# Patient Record
Sex: Female | Born: 1980 | Hispanic: No | Marital: Married | Smoking: Never smoker
Health system: Southern US, Community
[De-identification: ages and names within clinical notes are randomized; demographics above are authoritative.]

## PROBLEM LIST (undated history)

## (undated) ENCOUNTER — Inpatient Hospital Stay (HOSPITAL_COMMUNITY): Payer: Self-pay

## (undated) DIAGNOSIS — Z789 Other specified health status: Secondary | ICD-10-CM

## (undated) HISTORY — DX: Other specified health status: Z78.9

---

## 2017-03-31 ENCOUNTER — Other Ambulatory Visit (HOSPITAL_COMMUNITY)
Admission: RE | Admit: 2017-03-31 | Discharge: 2017-03-31 | Disposition: A | Discharge status: Home / Self Care | Payer: Medicaid Other | Source: Ambulatory Visit | Attending: Nurse Practitioner | Admitting: Nurse Practitioner

## 2017-03-31 ENCOUNTER — Encounter: Payer: Self-pay | Admitting: Nurse Practitioner

## 2017-03-31 ENCOUNTER — Other Ambulatory Visit: Payer: Self-pay

## 2017-03-31 ENCOUNTER — Ambulatory Visit (INDEPENDENT_AMBULATORY_CARE_PROVIDER_SITE_OTHER): Payer: Medicaid Other | Admitting: Nurse Practitioner

## 2017-03-31 ENCOUNTER — Encounter: Payer: Self-pay | Admitting: *Deleted

## 2017-03-31 VITALS — BP 124/46 | HR 66 | Wt 152.5 lb

## 2017-03-31 DIAGNOSIS — O09523 Supervision of elderly multigravida, third trimester: Secondary | ICD-10-CM | POA: Insufficient documentation

## 2017-03-31 DIAGNOSIS — O09522 Supervision of elderly multigravida, second trimester: Secondary | ICD-10-CM

## 2017-03-31 DIAGNOSIS — Z3A14 14 weeks gestation of pregnancy: Secondary | ICD-10-CM | POA: Diagnosis not present

## 2017-03-31 DIAGNOSIS — O0992 Supervision of high risk pregnancy, unspecified, second trimester: Secondary | ICD-10-CM | POA: Insufficient documentation

## 2017-03-31 DIAGNOSIS — Z3689 Encounter for other specified antenatal screening: Secondary | ICD-10-CM

## 2017-03-31 DIAGNOSIS — O0990 Supervision of high risk pregnancy, unspecified, unspecified trimester: Secondary | ICD-10-CM | POA: Insufficient documentation

## 2017-03-31 DIAGNOSIS — O34219 Maternal care for unspecified type scar from previous cesarean delivery: Secondary | ICD-10-CM

## 2017-03-31 DIAGNOSIS — Z98891 History of uterine scar from previous surgery: Secondary | ICD-10-CM | POA: Insufficient documentation

## 2017-03-31 DIAGNOSIS — O09529 Supervision of elderly multigravida, unspecified trimester: Secondary | ICD-10-CM

## 2017-03-31 DIAGNOSIS — K029 Dental caries, unspecified: Secondary | ICD-10-CM | POA: Insufficient documentation

## 2017-03-31 LAB — POCT URINALYSIS DIP (DEVICE)
Bilirubin Urine: NEGATIVE
Glucose, UA: NEGATIVE mg/dL
HGB URINE DIPSTICK: NEGATIVE
Ketones, ur: NEGATIVE mg/dL
NITRITE: NEGATIVE
PH: 7 (ref 5.0–8.0)
Protein, ur: NEGATIVE mg/dL
Specific Gravity, Urine: 1.02 (ref 1.005–1.030)
UROBILINOGEN UA: 0.2 mg/dL (ref 0.0–1.0)

## 2017-03-31 MED ORDER — PRENATAL PLUS 27-1 MG PO TABS: 1.0000 | ORAL_TABLET | Freq: Every day | ORAL | 11 refills | Status: AC

## 2017-03-31 NOTE — Progress Notes (Signed)
Subjective:   Brandi Wong is a 36 y.o. G2P1001 at 714w5d by LMP being seen today for her first obstetrical visit.  Her obstetrical history is significant for advanced maternal age and previous C/S birth. Patient does intend to breast feed. Pregnancy history fully reviewed.  Patient reports no complaints  Client does want an ultrasound today but discussed she will be scheduled for anatomy scan at 19 weeks.  There is no indication for US today.  She is worried as she lost 8 pounds prior to becoming pregnant and wants to know that the baby is OK.  HISTORY: Obstetric History   G2   P1   T1   P0   A0   L1    SAB0   TAB0   Ectopic0   Multiple0   Live Births1     # Outcome Date GA Lbr Len/2nd Weight Sex Delivery Anes PTL Lv  2 Current           1 Term 07/09/15 425w0d   F CS-LTranv EPI N LIV     Complications: Cephalopelvic Disproportion     Past Medical History:  Diagnosis Date  . Medical history non-contributory    Past Surgical History:  Procedure Laterality Date  . CESAREAN SECTION     History reviewed. No pertinent family history. Social History   Tobacco Use  . Smoking status: Never Smoker  . Smokeless tobacco: Never Used  Substance Use Topics  . Alcohol use: No    Frequency: Never  . Drug use: No   No Known Allergies Current Outpatient Medications on File Prior to Visit  Medication Sig Dispense Refill  . Prenatal Vit-Fe Fumarate-FA (PRENATAL MULTIVITAMIN) TABS tablet Take 1 tablet by mouth daily at 12 noon.     No current facility-administered medications on file prior to visit.      Exam   Vitals:   03/31/17 1325  BP: (!) 124/46  Pulse: 66  Weight: 152 lb 8 oz (69.2 kg)   Fetal Heart Rate (bpm): 165  Uterus:  Fundal Height: 15 cm  Chaperone present for pelvic exam  Pelvic Exam: Perineum: no hemorrhoids, normal perineum   Vulva: normal external genitalia, no lesions   Vagina:  normal mucosa, normal discharge   Cervix: no lesions and normal, pap smear  done.  Small amount of bleeding with Pap   Adnexa: normal adnexa and no mass, fullness, tenderness   Bony Pelvis: average  System: General: well-developed, well-nourished female in no acute distress   Breast:  normal appearance, no masses or tenderness   Skin: normal coloration and turgor, no rashes   Neurologic: oriented, normal, negative, normal mood   Extremities: normal strength, tone, and muscle mass, ROM of all joints is normal   HEENT PERRLA, extraocular movement intact and sclera clear, anicteric   Mouth/Teeth mucous membranes moist, pharynx normal without lesions and dental hygiene is poor - some teeth have large cavities   Neck supple and no masses   Cardiovascular: regular rate and rhythm   Respiratory:  no respiratory distress, normal breath sounds   Abdomen: soft, non-tender; bowel sounds normal; no masses,  no organomegaly     Assessment:   Pregnancy: G2P1001 Patient Active Problem List   Diagnosis Date Noted  . Supervision of high risk pregnancy, antepartum 03/31/2017  . Advanced maternal age in multigravida, unspecified trimester 03/31/2017     Plan:  1. Supervision of high risk pregnancy, antepartum  ROI for C/S Operative notes sent - will need  to review operative notes and MD will need to discuss delivery plan with client given that she had a failed vacuum birth last pregnancy with an average size baby.  She speaks [place] and declines any video interpreter  2. Elderly multigravida, second trimester  Discussed Panorama and client had this drawn today.    Quad screen not needed at next visit.  3.  Dental caries - [facility].  Letter given to client to take to clinic so she can have work done.  Declines Amharic interpreter Declines flu vaccine Initial labs drawn. Continue prenatal vitamins.  Rx sent to her pharmacy. Genetic Screening discussed, declined. Panorama drawn  Today due to advanced maternal age. Ultrasound  discussed; fetal anatomic survey: ordered. Problem list reviewed and updated. The nature [facility] with multiple MDs and other Advanced Practice Providers was explained to patient; also emphasized that residents, students are part of our team. Routine obstetric precautions reviewed. Return in about 4 weeks (around 04/28/2017).  Total face-to-face time with patient: 40 minutes.  Over 50% of encounter was spent on counseling and coordination of care.     Nolene BernheimERRI BURLESON, FNP Family Nurse Practitioner, [facility], [facility] 03/31/2017 6:49 PM

## 2017-03-31 NOTE — Patient Instructions (Addendum)
Second Trimester of Pregnancy The second trimester is from week 13 through week 28, month 4 through 6. This is often the time in pregnancy that you feel your best. Often times, morning sickness has lessened or quit. You may have more energy, and you may get hungry more often. Your unborn baby (fetus) is growing rapidly. At the end of the sixth month, he or she is about 9 inches long and weighs about 1 pounds. You will likely feel the baby move (quickening) between 18 and 20 weeks of pregnancy. Follow these instructions at home:  Avoid all smoking, herbs, and alcohol. Avoid drugs not approved by your doctor.  Do not use any tobacco products, including cigarettes, chewing tobacco, and electronic cigarettes. If you need help quitting, ask your doctor. You may get counseling or other support to help you quit.  Only take medicine as told by your doctor. Some medicines are safe and some are not during pregnancy.  Exercise only as told by your doctor. Stop exercising if you start having cramps.  Eat regular, healthy meals.  Wear a good support bra if your breasts are tender.  Do not use hot tubs, steam rooms, or saunas.  Wear your seat belt when driving.  Avoid raw meat, uncooked cheese, and liter boxes and soil used by cats.  Take your prenatal vitamins.  Take 1500-2000 milligrams of calcium daily starting at the 20th week of pregnancy until you deliver your baby.  Try taking medicine that helps you poop (stool softener) as needed, and if your doctor approves. Eat more fiber by eating fresh fruit, vegetables, and whole grains. Drink enough fluids to keep your pee (urine) clear or pale yellow.  Take warm water baths (sitz baths) to soothe pain or discomfort caused by hemorrhoids. Use hemorrhoid cream if your doctor approves.  If you have puffy, bulging veins (varicose veins), wear support hose. Raise (elevate) your feet for 15 minutes, 3-4 times a day. Limit salt in your diet.  Avoid heavy  lifting, wear low heals, and sit up straight.  Rest with your legs raised if you have leg cramps or low back pain.  Visit your dentist if you have not gone during your pregnancy. Use a soft toothbrush to brush your teeth. Be gentle when you floss.  You can have sex (intercourse) unless your doctor tells you not to.  Go to your doctor visits. Get help if:  You feel dizzy.  You have mild cramps or pressure in your lower belly (abdomen).  You have a nagging pain in your belly area.  You continue to feel sick to your stomach (nauseous), throw up (vomit), or have watery poop (diarrhea).  You have bad smelling fluid coming from your vagina.  You have pain with peeing (urination). Get help right away if:  You have a fever.  You are leaking fluid from your vagina.  You have spotting or bleeding from your vagina.  You have severe belly cramping or pain.  You lose or gain weight rapidly.  You have trouble catching your breath and have chest pain.  You notice sudden or extreme puffiness (swelling) of your face, hands, ankles, feet, or legs.  You have not felt the baby move in over an hour.  You have severe headaches that do not go away with medicine.  You have vision changes. This information is not intended to replace advice given to you by your health care provider. Make sure you discuss any questions you have with your health care   provider. Document Released: 07/15/2009 Document Revised: 09/26/2015 Document Reviewed: 06/21/2012 Elsevier Interactive Patient Education  2017 Elsevier Inc.  

## 2017-04-01 LAB — CYTOLOGY - PAP
Chlamydia: NEGATIVE
Diagnosis: NEGATIVE
HPV (WINDOPATH): NOT DETECTED
Neisseria Gonorrhea: NEGATIVE

## 2017-04-02 LAB — CULTURE, OB URINE

## 2017-04-02 LAB — URINE CULTURE, OB REFLEX

## 2017-04-09 ENCOUNTER — Encounter: Payer: Self-pay | Admitting: Obstetrics & Gynecology

## 2017-04-14 ENCOUNTER — Encounter: Payer: Self-pay | Admitting: Family Medicine

## 2017-04-14 DIAGNOSIS — O0990 Supervision of high risk pregnancy, unspecified, unspecified trimester: Secondary | ICD-10-CM

## 2017-04-19 ENCOUNTER — Encounter (HOSPITAL_COMMUNITY): Payer: Self-pay | Admitting: Nurse Practitioner

## 2017-04-28 ENCOUNTER — Ambulatory Visit (HOSPITAL_COMMUNITY): Admission: RE | Admit: 2017-04-28 | Payer: Medicaid Other | Source: Ambulatory Visit

## 2017-04-29 ENCOUNTER — Ambulatory Visit (HOSPITAL_COMMUNITY)
Admission: RE | Admit: 2017-04-29 | Discharge: 2017-04-29 | Disposition: A | Discharge status: Home / Self Care | Payer: Medicaid Other | Source: Ambulatory Visit | Attending: Nurse Practitioner | Admitting: Nurse Practitioner

## 2017-04-29 ENCOUNTER — Other Ambulatory Visit: Payer: Self-pay

## 2017-04-29 ENCOUNTER — Ambulatory Visit (INDEPENDENT_AMBULATORY_CARE_PROVIDER_SITE_OTHER): Payer: Medicaid Other | Admitting: Obstetrics and Gynecology

## 2017-04-29 ENCOUNTER — Other Ambulatory Visit: Payer: Self-pay | Admitting: Nurse Practitioner

## 2017-04-29 ENCOUNTER — Encounter (HOSPITAL_COMMUNITY): Payer: Self-pay

## 2017-04-29 ENCOUNTER — Encounter (HOSPITAL_COMMUNITY): Payer: Self-pay | Admitting: *Deleted

## 2017-04-29 VITALS — BP 110/64 | HR 97 | Ht 64.0 in | Wt 153.0 lb

## 2017-04-29 DIAGNOSIS — O0990 Supervision of high risk pregnancy, unspecified, unspecified trimester: Secondary | ICD-10-CM

## 2017-04-29 DIAGNOSIS — O09522 Supervision of elderly multigravida, second trimester: Secondary | ICD-10-CM

## 2017-04-29 DIAGNOSIS — Z3689 Encounter for other specified antenatal screening: Secondary | ICD-10-CM

## 2017-04-29 DIAGNOSIS — O34219 Maternal care for unspecified type scar from previous cesarean delivery: Secondary | ICD-10-CM

## 2017-04-29 DIAGNOSIS — Z3A18 18 weeks gestation of pregnancy: Secondary | ICD-10-CM | POA: Insufficient documentation

## 2017-04-29 DIAGNOSIS — O26872 Cervical shortening, second trimester: Secondary | ICD-10-CM

## 2017-04-29 DIAGNOSIS — O0992 Supervision of high risk pregnancy, unspecified, second trimester: Secondary | ICD-10-CM

## 2017-04-29 DIAGNOSIS — O09529 Supervision of elderly multigravida, unspecified trimester: Secondary | ICD-10-CM

## 2017-04-29 DIAGNOSIS — Z98891 History of uterine scar from previous surgery: Secondary | ICD-10-CM

## 2017-04-29 NOTE — Addendum Note (Signed)
Addended by: Henrietta DineNEAL, Michelina Mexicano S on: 04/29/2017 09:27 AM   Modules accepted: Orders

## 2017-04-29 NOTE — Progress Notes (Signed)
   PRENATAL VISIT NOTE  Subjective:  Brandi Wong is a 36 y.o. G2P1001 at 4049w6d being seen today for ongoing prenatal care.  She is currently monitored for the following issues for this low-risk pregnancy and has Supervision of high risk pregnancy, antepartum; Advanced maternal age in multigravida, unspecified trimester; Dental caries; and Previous cesarean section on their problem list.  Patient reports no complaints.  Contractions: Not present. Vag. Bleeding: None.  Movement: Present. Denies leaking of fluid.   The following portions of the patient's history were reviewed and updated as appropriate: allergies, current medications, past family history, past medical history, past social history, past surgical history and problem list. Problem list updated.  Objective:   Vitals:   04/29/17 0837 04/29/17 0838  BP: 110/64   Pulse: 97   Weight: 153 lb (69.4 kg)   Height:  5\' 4"  (1.626 m)    Fetal Status: Fetal Heart Rate (bpm): 156   Movement: Present     General:  Alert, oriented and cooperative. Patient is in no acute distress.  Skin: Skin is warm and dry. No rash noted.   Cardiovascular: Normal heart rate noted  Respiratory: Normal respiratory effort, no problems with respiration noted  Abdomen: Soft, gravid, appropriate for gestational age.  Pain/Pressure: Absent     Pelvic: Cervical exam deferred        Extremities: Normal range of motion.  Edema: None  Mental Status:  Normal mood and affect. Normal behavior. Normal judgment and thought content.   Assessment and Plan:  Pregnancy: G2P1001 at 7349w6d  1. Supervision of high risk pregnancy, antepartum Anatomy scheduled for today Redraw obstetric panel Redraw panorama  2. Advanced maternal age in multigravida, unspecified trimester  3. Previous cesarean section Need to obtain records, pt reports failed vacuum delivery and subsequent c-section  Preterm labor symptoms and general obstetric precautions including but not limited  to vaginal bleeding, contractions, leaking of fluid and fetal movement were reviewed in detail with the patient. Please refer to After Visit Summary for other counseling recommendations.  Return in about 4 weeks (around 05/27/2017) for OB visit.   Conan BowensKelly M Thomasina Housley, MD

## 2017-04-30 ENCOUNTER — Inpatient Hospital Stay (HOSPITAL_COMMUNITY): Payer: Medicaid Other

## 2017-04-30 ENCOUNTER — Ambulatory Visit (HOSPITAL_COMMUNITY): Payer: Medicaid Other | Admitting: Certified Registered Nurse Anesthetist

## 2017-04-30 ENCOUNTER — Other Ambulatory Visit: Payer: Self-pay

## 2017-04-30 ENCOUNTER — Other Ambulatory Visit (HOSPITAL_COMMUNITY): Payer: Self-pay | Admitting: *Deleted

## 2017-04-30 ENCOUNTER — Encounter (HOSPITAL_COMMUNITY)
Admission: RE | Disposition: A | Discharge status: Home / Self Care | Payer: Self-pay | Source: Ambulatory Visit | Attending: Obstetrics & Gynecology

## 2017-04-30 ENCOUNTER — Encounter (HOSPITAL_COMMUNITY): Payer: Self-pay | Admitting: Certified Registered Nurse Anesthetist

## 2017-04-30 ENCOUNTER — Inpatient Hospital Stay (HOSPITAL_COMMUNITY)
Admission: RE | Admit: 2017-04-30 | Discharge: 2017-05-02 | DRG: 819 | Disposition: A | Discharge status: Home / Self Care | Payer: Medicaid Other | Source: Ambulatory Visit | Attending: Obstetrics & Gynecology | Admitting: Obstetrics & Gynecology

## 2017-04-30 DIAGNOSIS — Z3A19 19 weeks gestation of pregnancy: Secondary | ICD-10-CM | POA: Diagnosis not present

## 2017-04-30 DIAGNOSIS — O3430 Maternal care for cervical incompetence, unspecified trimester: Secondary | ICD-10-CM

## 2017-04-30 DIAGNOSIS — O3432 Maternal care for cervical incompetence, second trimester: Secondary | ICD-10-CM

## 2017-04-30 DIAGNOSIS — O09522 Supervision of elderly multigravida, second trimester: Secondary | ICD-10-CM

## 2017-04-30 DIAGNOSIS — O3433 Maternal care for cervical incompetence, third trimester: Secondary | ICD-10-CM

## 2017-04-30 DIAGNOSIS — O34219 Maternal care for unspecified type scar from previous cesarean delivery: Secondary | ICD-10-CM | POA: Diagnosis present

## 2017-04-30 DIAGNOSIS — Z23 Encounter for immunization: Secondary | ICD-10-CM | POA: Diagnosis not present

## 2017-04-30 HISTORY — PX: CERVICAL CERCLAGE: SHX1329

## 2017-04-30 LAB — TYPE AND SCREEN
ABO/RH(D): O POS
Antibody Screen: NEGATIVE

## 2017-04-30 LAB — OBSTETRIC PANEL, INCLUDING HIV
ANTIBODY SCREEN: NEGATIVE
BASOS: 0 %
Basophils Absolute: 0 10*3/uL (ref 0.0–0.2)
EOS (ABSOLUTE): 0.1 10*3/uL (ref 0.0–0.4)
EOS: 1 %
HIV Screen 4th Generation wRfx: NONREACTIVE
Hematocrit: 31.3 % — ABNORMAL LOW (ref 34.0–46.6)
Hemoglobin: 10.6 g/dL — ABNORMAL LOW (ref 11.1–15.9)
Hepatitis B Surface Ag: NEGATIVE
IMMATURE GRANULOCYTES: 0 %
Immature Grans (Abs): 0 10*3/uL (ref 0.0–0.1)
LYMPHS ABS: 1.2 10*3/uL (ref 0.7–3.1)
LYMPHS: 13 %
MCH: 31.6 pg (ref 26.6–33.0)
MCHC: 33.9 g/dL (ref 31.5–35.7)
MCV: 93 fL (ref 79–97)
MONOS ABS: 0.4 10*3/uL (ref 0.1–0.9)
Monocytes: 5 %
NEUTROS PCT: 81 %
Neutrophils Absolute: 7.8 10*3/uL — ABNORMAL HIGH (ref 1.4–7.0)
Platelets: 238 10*3/uL (ref 150–379)
RBC: 3.35 x10E6/uL — AB (ref 3.77–5.28)
RDW: 15.2 % (ref 12.3–15.4)
RH TYPE: POSITIVE
RPR Ser Ql: NONREACTIVE
Rubella Antibodies, IGG: 3.72 index (ref >0.99)
WBC: 9.5 10*3/uL (ref 3.4–10.8)

## 2017-04-30 LAB — ABO/RH: ABO/RH(D): O POS

## 2017-04-30 SURGERY — CERCLAGE, CERVIX, VAGINAL APPROACH
Anesthesia: Spinal

## 2017-04-30 MED ORDER — ACETAMINOPHEN 325 MG PO TABS: 650.0000 mg | ORAL_TABLET | ORAL | Status: DC | PRN

## 2017-04-30 MED ORDER — INFLUENZA VAC SPLIT QUAD 0.5 ML IM SUSY
0.5000 mL | PREFILLED_SYRINGE | INTRAMUSCULAR | Status: AC
  Administered 2017-05-01: 0.5 mL via INTRAMUSCULAR
  Filled 2017-04-30: qty 0.5

## 2017-04-30 MED ORDER — AMOXICILLIN 500 MG PO CAPS: 500.0000 mg | ORAL_CAPSULE | Freq: Three times a day (TID) | ORAL | Status: DC

## 2017-04-30 MED ORDER — FENTANYL CITRATE (PF) 100 MCG/2ML IJ SOLN
25.0000 ug | INTRAMUSCULAR | Status: DC | PRN
Start: 2017-04-30 — End: 2017-04-30

## 2017-04-30 MED ORDER — INDOMETHACIN 50 MG RE SUPP
100.0000 mg | Freq: Once | RECTAL | Status: DC
  Filled 2017-04-30: qty 2

## 2017-04-30 MED ORDER — SODIUM CHLORIDE 0.9% FLUSH
3.0000 mL | INTRAVENOUS | Status: DC | PRN
  Administered 2017-04-30 – 2017-05-01 (×2): 3 mL via INTRAVENOUS
  Filled 2017-04-30 (×2): qty 3

## 2017-04-30 MED ORDER — ZOLPIDEM TARTRATE 5 MG PO TABS: 5.0000 mg | ORAL_TABLET | Freq: Every evening | ORAL | Status: DC | PRN

## 2017-04-30 MED ORDER — AZITHROMYCIN 250 MG PO TABS
500.0000 mg | ORAL_TABLET | Freq: Every day | ORAL | Status: DC
  Administered 2017-04-30 – 2017-05-01 (×2): 500 mg via ORAL
  Filled 2017-04-30 (×2): qty 2

## 2017-04-30 MED ORDER — BUPIVACAINE IN DEXTROSE 0.75-8.25 % IT SOLN
INTRATHECAL | Status: AC
  Filled 2017-04-30: qty 2

## 2017-04-30 MED ORDER — LACTATED RINGERS IV SOLN: INTRAVENOUS | Status: DC

## 2017-04-30 MED ORDER — FENTANYL CITRATE (PF) 100 MCG/2ML IJ SOLN
INTRAMUSCULAR | Status: AC
  Filled 2017-04-30: qty 2

## 2017-04-30 MED ORDER — BUPIVACAINE HCL (PF) 0.5 % IJ SOLN
INTRAMUSCULAR | Status: AC
  Filled 2017-04-30: qty 30

## 2017-04-30 MED ORDER — INDOMETHACIN 50 MG RE SUPP
50.0000 mg | Freq: Two times a day (BID) | RECTAL | Status: DC
  Filled 2017-04-30 (×2): qty 1

## 2017-04-30 MED ORDER — FENTANYL CITRATE (PF) 100 MCG/2ML IJ SOLN
INTRAMUSCULAR | Status: DC | PRN
  Administered 2017-04-30 (×4): 25 ug via INTRAVENOUS

## 2017-04-30 MED ORDER — PROMETHAZINE HCL 25 MG/ML IJ SOLN
6.2500 mg | INTRAMUSCULAR | Status: DC | PRN
Start: 2017-04-30 — End: 2017-04-30

## 2017-04-30 MED ORDER — SODIUM CHLORIDE 0.9 % IV SOLN
2.0000 g | Freq: Four times a day (QID) | INTRAVENOUS | Status: AC
  Administered 2017-04-30 – 2017-05-02 (×8): 2 g via INTRAVENOUS
  Filled 2017-04-30 (×8): qty 2000

## 2017-04-30 MED ORDER — INDOMETHACIN 50 MG RE SUPP
50.0000 mg | Freq: Once | RECTAL | Status: AC
  Administered 2017-04-30: 50 mg via RECTAL
  Filled 2017-04-30: qty 1

## 2017-04-30 MED ORDER — DOCUSATE SODIUM 100 MG PO CAPS
100.0000 mg | ORAL_CAPSULE | Freq: Every day | ORAL | Status: DC
  Administered 2017-04-30 – 2017-05-01 (×2): 100 mg via ORAL
  Filled 2017-04-30: qty 1

## 2017-04-30 MED ORDER — SODIUM CHLORIDE 0.9% FLUSH
3.0000 mL | Freq: Two times a day (BID) | INTRAVENOUS | Status: DC
  Administered 2017-04-30 – 2017-05-01 (×3): 3 mL via INTRAVENOUS

## 2017-04-30 MED ORDER — LACTATED RINGERS IV SOLN
INTRAVENOUS | Status: DC
  Administered 2017-04-30 (×2): via INTRAVENOUS

## 2017-04-30 MED ORDER — BUPIVACAINE IN DEXTROSE 0.75-8.25 % IT SOLN
INTRATHECAL | Status: DC | PRN
  Administered 2017-04-30: 1.2 mL via INTRATHECAL

## 2017-04-30 MED ORDER — CALCIUM CARBONATE ANTACID 500 MG PO CHEW: 2.0000 | CHEWABLE_TABLET | ORAL | Status: DC | PRN

## 2017-04-30 SURGICAL SUPPLY — 17 items
CANISTER SUCT 3000ML PPV (MISCELLANEOUS) ×3 IMPLANT
GLOVE BIO SURGEON STRL SZ7 (GLOVE) ×6 IMPLANT
GLOVE BIOGEL PI IND STRL 7.0 (GLOVE) ×2 IMPLANT
GLOVE BIOGEL PI INDICATOR 7.0 (GLOVE) ×4
GOWN STRL REUS W/TWL LRG LVL3 (GOWN DISPOSABLE) ×6 IMPLANT
GOWN STRL REUS W/TWL XL LVL3 (GOWN DISPOSABLE) ×3 IMPLANT
NEEDLE MAYO CATGUT SZ4 (NEEDLE) ×3 IMPLANT
PACK VAGINAL MINOR WOMEN LF (CUSTOM PROCEDURE TRAY) ×3 IMPLANT
PAD OB MATERNITY 4.3X12.25 (PERSONAL CARE ITEMS) ×3 IMPLANT
PAD PREP 24X48 CUFFED NSTRL (MISCELLANEOUS) ×3 IMPLANT
SUT ETHIBOND  5 (SUTURE) ×2
SUT ETHIBOND 5 (SUTURE) ×1 IMPLANT
TOWEL OR 17X24 6PK STRL BLUE (TOWEL DISPOSABLE) ×6 IMPLANT
TRAY FOLEY CATH SILVER 14FR (SET/KITS/TRAYS/PACK) ×3 IMPLANT
TUBING NON-CON 1/4 X 20 CONN (TUBING) IMPLANT
TUBING NON-CON 1/4 X 20' CONN (TUBING)
YANKAUER SUCT BULB TIP NO VENT (SUCTIONS) IMPLANT

## 2017-04-30 NOTE — Brief Op Note (Signed)
Procedure not done as pt has fluid leakage from the the vagina.  Brandi Wong, M.D., Evern CoreFACOG

## 2017-04-30 NOTE — Progress Notes (Signed)
Patient ID: Brandi Wong, female   DOB: 02/03/1981, 36 y.o.   MRN: [id number] ACULTY PRACTICE ANTEPARTUM COMPREHENSIVE PROGRESS NOTE  Brandi Wong is a 36 y.o. G2P1001 at 4526w0d  who is admitted for possible ROM. Pt was in the OR for a cerclage. Prior to the procedure pt was noted to have leaking of fluid. It was unclear if this was amniotic fluid or urine but, there was continued drainage after the Foley cath was place.d pt reported falling this am prior or the procedure. The procedure was aborted until ROM of membranes could be confirmed or ruled out.    Length of Stay:  0  Days  Subjective: Pt denies pain or further leaking. She denies bleeding.   Patient reports good fetal movement.  She reports no uterine contractions, no bleeding and no loss of fluid per vagina.  Vitals:  Blood pressure (!) 110/58, pulse 78, temperature 98.4 F (36.9 C), resp. rate 18, height 5\' 4"  (1.626 m), weight 153 lb (69.4 kg), last menstrual period 12/18/2016, SpO2 100 %. Physical Examination: General appearance - alert, well appearing, and in no distress Cervical Exam: Not evaluated.   Fetal Monitoring: + FHR  Labs:  Results for orders placed or performed during the hospital encounter of 04/30/17 (from the past 24 hour(s))  Type and screen [name]   Collection Time: 04/30/17 11:26 AM  Result Value Ref Range   ABO/RH(D) O POS    Antibody Screen NEG    Sample Expiration 05/03/2017     Imaging Studies:    04/30/2017 Impression  SIUP at 5926w0d  active fetus  amniotic fluid volume is gestational age appropriate  no previa  cervix is funneled but not technically open by transabdominal  ultrasound  today's findings by ultrasound do not confirm preterm  premature rupture of membranes or disprove it.  However, in  absence of a positive amnisure, positive ferning, or positive  nitrazine, I question whether the vaginal pooling seen in the  OR was actually amniotic fluid versus  urine. ---------------------------------------------------------------------- Recommendations  I spoke with Dr. Erin FullingHarraway-Smith regarding my  impressions/recommendations as follows:  -I feel it is reasonable to continue latency antibiotics to  increase chances of successful ultrasound indicated/rescue  cerclage as now recommended by some experts.  -I would repeat the speculum exam tomorrow to facilitate  collection of Amnisure and reassessment for  pooling/ferning/nitrazine.  -If speculum exam remains negative and Amnisure is  negative, then I feel that it seems unlikely she has pPROM  and remains as an excellent candidate for cerclage provided  no infection/labor signs/symptoms are noted in the interim.  Medications:  Scheduled . [START ON 05/02/2017] amoxicillin  500 mg Oral Q8H  . azithromycin  500 mg Oral Daily  . docusate sodium  100 mg Oral Daily  . [START ON 05/01/2017] Influenza vac split quadrivalent PF  0.5 mL Intramuscular Tomorrow-1000  . sodium chloride flush  3 mL Intravenous Q12H   I have reviewed the patient's current medications.  ASSESSMENT: Patient Active Problem List   Diagnosis Date Noted  . Cervical insufficiency during pregnancy, antepartum 04/30/2017  . Supervision of high risk pregnancy, antepartum 03/31/2017  . Advanced maternal age in multigravida, unspecified trimester 03/31/2017  . Dental caries 03/31/2017  . Previous cesarean section 03/31/2017    PLAN: Amnisure in am If Amnisure negative would start pt on Indocin for 24 hours and do cerclage on 05/02/2017 (she will need to be posted if Amnisure neg) NPO after mn on tomorrow if  Amnisure neg Reg diet for now Cont latency antibiotics Continue routine antenatal care.   [name] Harraway-Smith 04/30/2017,3:49 PM

## 2017-04-30 NOTE — Transfer of Care (Signed)
Immediate Anesthesia Transfer of Care Note  Patient: Brandi Wong  Procedure(s) Performed: CERCLAGE CERVICAL (N/A )  Patient Location: PACU  Anesthesia Type:Spinal  Level of Consciousness: awake, alert , oriented and patient cooperative  Airway & Oxygen Therapy: Patient Spontanous Breathing  Post-op Assessment: Report given to RN and Post -op Vital signs reviewed and stable  Post vital signs: Reviewed and stable  Last Vitals:  Vitals:   04/30/17 0739  BP: 113/62  Pulse: 77  Resp: 16  Temp: (!) 36.4 C  SpO2: 100%    Last Pain:  Vitals:   04/30/17 0739  TempSrc: Oral      Patients Stated Pain Goal: 4 (04/30/17 0739)  Complications: No apparent anesthesia complications

## 2017-04-30 NOTE — Anesthesia Procedure Notes (Signed)
Spinal  Patient location during procedure: OR Start time: 04/30/2017 8:51 AM End time: 04/30/2017 8:53 AM Staffing Anesthesiologist: Cecile Hearingurk, Moriah Shawley Edward, MD Performed: anesthesiologist  Preanesthetic Checklist Completed: patient identified, surgical consent, pre-op evaluation, timeout performed, IV checked, risks and benefits discussed and monitors and equipment checked Spinal Block Patient position: sitting Prep: site prepped and draped and DuraPrep Patient monitoring: continuous pulse ox and blood pressure Approach: midline Location: L3-4 Injection technique: single-shot Needle Needle type: Pencan  Needle gauge: 24 G Assessment Sensory level: T10 Additional Notes Functioning IV was confirmed and monitors were applied. Sterile prep and drape, including hand hygiene, mask and sterile gloves were used. The patient was positioned and the spine was prepped. The skin was anesthetized with lidocaine.  Free flow of clear CSF was obtained prior to injecting local anesthetic into the CSF.  The spinal needle aspirated freely following injection.  The needle was carefully withdrawn.  The patient tolerated the procedure well. Consent was obtained prior to procedure with all questions answered and concerns addressed. Risks including but not limited to bleeding, infection, nerve damage, paralysis, failed block, inadequate analgesia, allergic reaction, high spinal, itching and headache were discussed and the patient wished to proceed.   Arrie AranStephen Tymere Depuy, MD

## 2017-04-30 NOTE — OR Nursing (Signed)
Case cancelled By Dr. Burnice LoganHarraway Katrinka Blazing-Smith due to fluid leaking.

## 2017-04-30 NOTE — Anesthesia Postprocedure Evaluation (Signed)
Anesthesia Post Note  Patient: Selby Stegenga  Procedure(s) Performed: CERCLAGE CERVICAL (N/A )     Patient location during evaluation: PACU Anesthesia Type: Spinal Level of consciousness: oriented and awake and alert Pain management: pain level controlled Vital Signs Assessment: post-procedure vital signs reviewed and stable Respiratory status: spontaneous breathing, respiratory function stable and patient connected to nasal cannula oxygen Cardiovascular status: blood pressure returned to baseline and stable Postop Assessment: no headache, no backache, no apparent nausea or vomiting, spinal receding and patient able to bend at knees Anesthetic complications: no    Last Vitals:  Vitals:   04/30/17 1110 04/30/17 1118  BP: (!) 110/36 (!) 110/58  Pulse:  78  Resp: 18   Temp: 36.9 C   SpO2: 100%     Last Pain:  Vitals:   04/30/17 1449  TempSrc:   PainSc: 0-No pain   Pain Goal: Patients Stated Pain Goal: 4 (04/30/17 0739)               Cecile HearingStephen Edward Turk

## 2017-04-30 NOTE — Anesthesia Preprocedure Evaluation (Signed)
Anesthesia Evaluation  Patient identified by MRN, date of birth, ID band Patient awake    Reviewed: Allergy & Precautions, NPO status , Patient's Chart, lab work & pertinent test results  Airway Mallampati: II  TM Distance: >3 FB Neck ROM: Full    Dental  (+) Teeth Intact, Dental Advisory Given   Pulmonary neg pulmonary ROS,    Pulmonary exam normal breath sounds clear to auscultation       Cardiovascular Exercise Tolerance: Good negative cardio ROS Normal cardiovascular exam Rhythm:Regular Rate:Normal     Neuro/Psych negative neurological ROS  negative psych ROS   GI/Hepatic negative GI ROS, Neg liver ROS,   Endo/Other  negative endocrine ROS  Renal/GU negative Renal ROS     Musculoskeletal negative musculoskeletal ROS (+)   Abdominal   Peds  Hematology  (+) Blood dyscrasia, anemia , Plt 238k   Anesthesia Other Findings Day of surgery medications reviewed with the patient.  Reproductive/Obstetrics (+) Pregnancy                            Anesthesia Physical Anesthesia Plan  ASA: II  Anesthesia Plan: Spinal   Post-op Pain Management:    Induction:   PONV Risk Score and Plan: 2 and Ondansetron and Treatment may vary due to age or medical condition  Airway Management Planned:   Additional Equipment:   Intra-op Plan:   Post-operative Plan:   Informed Consent: I have reviewed the patients History and Physical, chart, labs and discussed the procedure including the risks, benefits and alternatives for the proposed anesthesia with the patient or authorized representative who has indicated his/her understanding and acceptance.   Dental advisory given  Plan Discussed with: CRNA, Anesthesiologist and Surgeon  Anesthesia Plan Comments: (Discussed risks and benefits of and differences between spinal and general. Discussed risks of spinal including headache, backache, failure,  bleeding, infection, and nerve damage. Patient consents to spinal. Questions answered. Coagulation studies and platelet count acceptable.)        Anesthesia Quick Evaluation

## 2017-04-30 NOTE — H&P (Addendum)
Preoperative History and Physical  Brandi Wong is a 36 y.o. G2P1001 here for surgical management of cervical insufficinecy.   Proposed surgery: McDonald cerclage  Past Medical History:  Diagnosis Date  . Medical history non-contributory    Past Surgical History:  Procedure Laterality Date  . CESAREAN SECTION     OB History    Gravida Para Term Preterm AB Living   2 1 1     1    SAB TAB Ectopic Multiple Live Births           1     Patient denies any cervical dysplasia or STIs. Medications Prior to Admission  Medication Sig Dispense Refill Last Dose  . prenatal vitamin w/FE, FA (PRENATAL 1 + 1) 27-1 MG TABS tablet Take 1 tablet by mouth daily. 30 each 11 04/29/2017 at Unknown time    No Known Allergies Social History:   reports that  has never smoked. she has never used smokeless tobacco. She reports that she does not drink alcohol or use drugs. History reviewed. No pertinent family history.  Review of Systems: Noncontributory  PHYSICAL EXAM: Blood pressure 113/62, pulse 77, temperature (!) 97.5 F (36.4 C), temperature source Oral, resp. rate 16, height 5\' 4"  (1.626 m), weight 153 lb (69.4 kg), last menstrual period 12/18/2016, SpO2 100 %. General appearance - alert, well appearing, and in no distress Chest - clear to auscultation, no wheezes, rales or rhonchi, symmetric air entry Heart - normal rate and regular rhythm Abdomen - soft, nontender, nondistended, no masses or organomegaly Pelvic - examination not indicated Extremities - peripheral pulses normal, no pedal edema, no clubbing or cyanosis FHR: 164  Labs: Results for orders placed or performed in visit on 04/29/17 (from the past 336 hour(s))  Obstetric Panel, Including HIV   Collection Time: 04/29/17  9:33 AM  Result Value Ref Range   Hepatitis B Surface Ag Negative Negative   RPR Ser Ql Non Reactive Non Reactive   Rubella Antibodies, IGG 3.72 Immune >0.99 index   ABO Grouping O    Rh Factor Positive     Antibody Screen Negative Negative   HIV Screen 4th Generation wRfx Non Reactive Non Reactive   WBC 9.5 3.4 - 10.8 x10E3/uL   RBC 3.35 (L) 3.77 - 5.28 x10E6/uL   Hemoglobin 10.6 (L) 11.1 - 15.9 g/dL   Hematocrit 98.131.3 (L) 19.134.0 - 46.6 %   MCV 93 79 - 97 fL   MCH 31.6 26.6 - 33.0 pg   MCHC 33.9 31.5 - 35.7 g/dL   RDW 47.815.2 29.512.3 - 62.115.4 %   Platelets 238 150 - 379 x10E3/uL   Neutrophils 81 Not Estab. %   Lymphs 13 Not Estab. %   Monocytes 5 Not Estab. %   Eos 1 Not Estab. %   Basos 0 Not Estab. %   Neutrophils Absolute 7.8 (H) 1.4 - 7.0 x10E3/uL   Lymphocytes Absolute 1.2 0.7 - 3.1 x10E3/uL   Monocytes Absolute 0.4 0.1 - 0.9 x10E3/uL   EOS (ABSOLUTE) 0.1 0.0 - 0.4 x10E3/uL   Basophils Absolute 0.0 0.0 - 0.2 x10E3/uL   Immature Granulocytes 0 Not Estab. %   Immature Grans (Abs) 0.0 0.0 - 0.1 x10E3/uL    Imaging Studies: [place] Mfm Ob Transvaginal  Result Date: 04/29/2017 ----------------------------------------------------------------------  OBSTETRICS REPORT                      (Signed Final 04/29/2017 05:02 pm) ---------------------------------------------------------------------- Patient Info  ID #:  [id number]                          D.O.B.:  [birth year] (36 yrs)  Name:       Brandi Wong                   Visit Date: 04/29/2017 12:43 pm ---------------------------------------------------------------------- Performed By  Performed By:     Lenise Arena        Ref. Address:     [address]                    RDMS                                                             Alene Mires  Attending:        Particia Nearing MD       Location:         [facility]  Referred By:      Currie Paris NP ---------------------------------------------------------------------- Orders   #  Description                                 Code   1  [place] MFM OB DETAIL +14 WK                     76811.01   2  [place] MFM OB  TRANSVAGINAL                      40981.1  ----------------------------------------------------------------------   #  Ordered By               Order #        Accession #    Episode #   1  Nolene Bernheim           [id number]      [id number]     [id number]   2  Nolene Bernheim           [id number]      [id number]     [id number]  ---------------------------------------------------------------------- Indications   [redacted] weeks gestation of pregnancy                Z3A.18   Encounter for fetal anatomic survey            Z36.89   Advanced maternal age multigravida 63+,        O70.522   second trimester; NIPS pending   Previous cesarean delivery, antepartum         O34.219  ---------------------------------------------------------------------- OB History  Blood Type:            Height:  5'4"   Weight (lb):  154  BMI:  26.43  Gravidity:    2         Term:   1  Living:       1 ---------------------------------------------------------------------- Fetal Evaluation  Num Of Fetuses:     1  Fetal Heart         157  Rate(bpm):  Cardiac Activity:   Observed  Presentation:       Transverse, head to maternal right  Placenta:           Fundal, above cervical os  P. Cord Insertion:  Visualized, central  Amniotic Fluid  AFI FV:      Subjectively within normal limits                              Largest Pocket(cm)                              5.29 ---------------------------------------------------------------------- Biometry  BPD:      46.1  mm     G. Age:  20w 0d         89  %    CI:        75.37   %    70 - 86                                                          FL/HC:      16.9   %    16.1 - 18.3  HC:      168.4  mm     G. Age:  19w 4d         73  %    HC/AC:      1.08        1.09 - 1.39  AC:      155.5  mm     G. Age:  20w 5d         93  %    FL/BPD:     61.8   %  FL:       28.5  mm     G. Age:  18w 5d         40  %    FL/AC:      18.3   %    20 - 24  CER:      18.7  mm     G. Age:  18w 2d         34  %  NFT:       6.1  mm  CM:         4.2  mm  Est. FW:     313  gm    0 lb 11 oz      59  % ---------------------------------------------------------------------- Gestational Age  LMP:           18w 6d        Date:  12/18/16                 EDD:   09/24/17  U/S Today:     19w 5d  EDD:   09/18/17  Best:          18w 6d     Det. By:  LMP  (12/18/16)          EDD:   09/24/17 ---------------------------------------------------------------------- Anatomy  Cranium:               Appears normal         Aortic Arch:            Appears normal  Cavum:                 Appears normal         Ductal Arch:            Not well visualized  Ventricles:            Appears normal         Diaphragm:              Appears normal  Choroid Plexus:        Appears normal         Stomach:                Appears normal, left                                                                        sided  Cerebellum:            Appears normal         Abdomen:                Appears normal  Posterior Fossa:       Appears normal         Abdominal Wall:         Appears nml (cord                                                                        insert, abd wall)  Nuchal Fold:           Appears normal         Cord Vessels:           Appears normal (3                                                                        vessel cord)  Face:                  Appears normal         Kidneys:                Appear normal                         (  orbits and profile)  Lips:                  Appears normal         Bladder:                Appears normal  Thoracic:              Appears normal         Spine:                  Not well visualized  Heart:                 Appears normal         Upper Extremities:      Appears normal                         (4CH, axis, and situs  RVOT:                  Appears normal         Lower Extremities:      Appears normal  LVOT:                  Appears normal  Other:  Female gender. Heels and 5th digit visualized.  Nasal bone visualized.          Technically difficult due to fetal position. ---------------------------------------------------------------------- Cervix Uterus Adnexa  Cervix  Length:           0.74  cm.  Appears funnelled, see comments. Measured transvaginally.  Uterus  No abnormality visualized.  Left Ovary  Within normal limits.  Right Ovary  No adnexal mass visualized.  Cul De Sac:   No free fluid seen. ---------------------------------------------------------------------- Impression  SIUP at 18+6 weeks  Normal detailed fetal anatomy; limited views of spine and the  DA  Markers of aneuploidy: none  Normal amniotic fluid volume  Measurements consistent with LMP dating  EV views of cervix: funneling of internal os with distal closed  portion measuring 7 mms; membranes prolapsed into cervix  The US findings were shared with Ms. Monarrez and her  husband.  The implications of cervical insufficiency were  discussed in detail. They were offered a cerclage and  expectant management with vaginal progesterone. After  careful consideration, Ms. Sur decided to pursue cerclage  placement. Case was discussed with Dr. Erin Fulling and  the procedure was scheduled for tomorrow at 0730. She was  advised to be NPO after MN. ---------------------------------------------------------------------- Recommendations  Follow-up ultrasound for cervical length in one week.  Follow-up ultrasound in 4 weeks to complete anatomy survey  and assess growth ----------------------------------------------------------------------                 Particia Nearing, MD Electronically Signed Final Report   04/29/2017 05:02 pm ----------------------------------------------------------------------  [place] Mfm Ob Detail +14 Wk  Result Date: 04/29/2017 ----------------------------------------------------------------------  OBSTETRICS REPORT                      (Signed Final 04/29/2017 05:02 pm)  ---------------------------------------------------------------------- Patient Info  ID #:       [id number]                          D.O.B.:  March 28, 1981 (36 yrs)  Name:       Brandi Wong  Visit Date: 04/29/2017 12:43 pm ---------------------------------------------------------------------- Performed By  Performed By:     Lenise Arena        Ref. Address:     [address]                    [place]                                                             Alene Mires  Attending:        Particia Nearing MD       Location:         [facility]  Referred By:      Currie Paris NP ---------------------------------------------------------------------- Orders   #  Description                                 Code   1  [place] MFM OB DETAIL +14 WK                     76811.01   2  [place] MFM OB TRANSVAGINAL                      16109.6  ----------------------------------------------------------------------   #  Ordered By               Order #        Accession #    Episode #   1  Nolene Bernheim           [id number]      [id number]     [id number]   2  Nolene Bernheim           [id number]      [id number]     [id number]  ---------------------------------------------------------------------- Indications   [redacted] weeks gestation of pregnancy                Z3A.18   Encounter for fetal anatomic survey            Z36.89   Advanced maternal age multigravida 7+,        O41.522   second trimester; NIPS pending   Previous cesarean delivery, antepartum         O34.219  ---------------------------------------------------------------------- OB History  Blood Type:            Height:  5'4"   Weight (lb):  154       BMI:  26.43  Gravidity:    2         Term:   1  Living:       1 ---------------------------------------------------------------------- Fetal Evaluation  Num Of Fetuses:     1  Fetal Heart         157  Rate(bpm):  Cardiac Activity:    Observed  Presentation:       Transverse, head to maternal right  Placenta:           Fundal, above cervical os  P. Cord Insertion:  Visualized, central  Amniotic Fluid  AFI FV:      Subjectively within normal limits                              Largest Pocket(cm)                              5.29 ---------------------------------------------------------------------- Biometry  BPD:      46.1  mm     G. Age:  20w 0d         89  %    CI:        75.37   %    70 - 86                                                          FL/HC:      16.9   %    16.1 - 18.3  HC:      168.4  mm     G. Age:  19w 4d         73  %    HC/AC:      1.08        1.09 - 1.39  AC:      155.5  mm     G. Age:  20w 5d         93  %    FL/BPD:     61.8   %  FL:       28.5  mm     G. Age:  18w 5d         40  %    FL/AC:      18.3   %    20 - 24  CER:      18.7  mm     G. Age:  18w 2d         34  %  NFT:       6.1  mm  CM:        4.2  mm  Est. FW:     313  gm    0 lb 11 oz      59  % ---------------------------------------------------------------------- Gestational Age  LMP:           18w 6d        Date:  12/18/16                 EDD:   09/24/17  U/S Today:     19w 5d                                        EDD:   09/18/17  Best:          18w 6d     Det. By:  LMP  (12/18/16)          EDD:   09/24/17 ----------------------------------------------------------------------  Anatomy  Cranium:               Appears normal         Aortic Arch:            Appears normal  Cavum:                 Appears normal         Ductal Arch:            Not well visualized  Ventricles:            Appears normal         Diaphragm:              Appears normal  Choroid Plexus:        Appears normal         Stomach:                Appears normal, left                                                                        sided  Cerebellum:            Appears normal         Abdomen:                Appears normal  Posterior Fossa:       Appears normal         Abdominal Wall:          Appears nml (cord                                                                        insert, abd wall)  Nuchal Fold:           Appears normal         Cord Vessels:           Appears normal (3                                                                        vessel cord)  Face:                  Appears normal         Kidneys:                Appear normal                         (orbits and profile)  Lips:                  Appears normal         Bladder:  Appears normal  Thoracic:              Appears normal         Spine:                  Not well visualized  Heart:                 Appears normal         Upper Extremities:      Appears normal                         (4CH, axis, and situs  RVOT:                  Appears normal         Lower Extremities:      Appears normal  LVOT:                  Appears normal  Other:  Female gender. Heels and 5th digit visualized. Nasal bone visualized.          Technically difficult due to fetal position. ---------------------------------------------------------------------- Cervix Uterus Adnexa  Cervix  Length:           0.74  cm.  Appears funnelled, see comments. Measured transvaginally.  Uterus  No abnormality visualized.  Left Ovary  Within normal limits.  Right Ovary  No adnexal mass visualized.  Cul De Sac:   No free fluid seen. ---------------------------------------------------------------------- Impression  SIUP at 18+6 weeks  Normal detailed fetal anatomy; limited views of spine and the  DA  Markers of aneuploidy: none  Normal amniotic fluid volume  Measurements consistent with LMP dating  EV views of cervix: funneling of internal os with distal closed  portion measuring 7 mms; membranes prolapsed into cervix  The US findings were shared with Ms. Flock and her  husband.  The implications of cervical insufficiency were  discussed in detail. They were offered a cerclage and  expectant management with vaginal progesterone. After  careful consideration,  Ms. Wilton decided to pursue cerclage  placement. Case was discussed with Dr. Erin Fulling and  the procedure was scheduled for tomorrow at 0730. She was  advised to be NPO after MN. ---------------------------------------------------------------------- Recommendations  Follow-up ultrasound for cervical length in one week.  Follow-up ultrasound in 4 weeks to complete anatomy survey  and assess growth ----------------------------------------------------------------------                 Particia Nearing, MD Electronically Signed Final Report   04/29/2017 05:02 pm ----------------------------------------------------------------------   Assessment: Patient Active Problem List   Diagnosis Date Noted  . Cervical insufficiency during pregnancy, antepartum 04/30/2017  . Supervision of high risk pregnancy, antepartum 03/31/2017  . Advanced maternal age in multigravida, unspecified trimester 03/31/2017  . Dental caries 03/31/2017  . Previous cesarean section 03/31/2017    Plan: Patient will undergo surgical management with McDonald cerclage.   The risks of surgery were discussed in detail with the patient including but not limited to: bleeding which may require transfusion or reoperation; infection which may require antibiotics; injury to surrounding organs which may involve bowel, bladder, ureters ; need for additional procedures including laparoscopy or laparotomy; thromboembolic phenomenon, surgical site problems and other postoperative/anesthesia complications. I have reviewed with her the risk of inadvertent rupture of membranes or failure of the procedure that may result in fetal loss. Likelihood of success in alleviating the patient's condition was discussed. Routine postoperative  instructions will be reviewed with the patient and her family in detail after surgery.  The patient concurred with the proposed plan, giving informed written consent for the surgery.  Patient has been NPO since last night she  will remain NPO for procedure.  Anesthesia and OR aware.  Preoperative prophylactic antibiotics and SCDs ordered on call to the OR.  To OR when ready.  Armando Lauman L. Harraway-Smith, M.D., [facility] 04/30/2017 8:02 AM

## 2017-05-01 ENCOUNTER — Encounter (HOSPITAL_COMMUNITY): Payer: Self-pay | Admitting: Obstetrics & Gynecology

## 2017-05-01 DIAGNOSIS — O3433 Maternal care for cervical incompetence, third trimester: Secondary | ICD-10-CM

## 2017-05-01 LAB — URINE CULTURE: Organism ID, Bacteria: NO GROWTH

## 2017-05-01 LAB — AMNISURE RUPTURE OF MEMBRANE (ROM) NOT AT ARMC: AMNISURE: NEGATIVE

## 2017-05-01 MED ORDER — DEXTROSE IN LACTATED RINGERS 5 % IV SOLN
INTRAVENOUS | Status: DC
  Administered 2017-05-02: 08:00:00 via INTRAVENOUS

## 2017-05-01 NOTE — Progress Notes (Signed)
Patient ID: Brandi Wong, female   DOB: 10-22-1980, 36 y.o.   MRN: [id number] FACULTY PRACTICE ANTEPARTUM(COMPREHENSIVE) NOTE  Brandi Wong is a 36 y.o. G2P1001 at 5665w1d who is admitted for incompetent cervix.   Fetal presentation is unsure. Length of Stay:  1  Days  Subjective:  Patient reports the fetal movement as active. Patient reports uterine contraction  activity as none. Patient reports  vaginal bleeding as none. Patient describes fluid per vagina as Other mucus.  Vitals:  Blood pressure (!) 107/55, pulse 75, temperature 98.4 F (36.9 C), temperature source Oral, resp. rate 18, height 5\' 4"  (1.626 m), weight 68.9 kg (152 lb 0 oz), last menstrual period 12/18/2016, SpO2 100 %. Physical Examination:  General appearance - alert, well appearing, and in no distress Heart - normal rate and regular rhythm Abdomen - soft, nontender, nondistended Fundal Height:  size equals dates Cervical Exam: Not evaluated. Extremities: extremities normal, atraumatic, no cyanosis or edema  Membranes:intact amnisure negative Fetal Monitoring:     Fetal Heart Rate A  Mode Doppler filed at 04/30/2017 2126  Baseline Rate (A) 153 bpm filed at 04/30/2017 2126     Labs:  Results for orders placed or performed during the hospital encounter of 04/30/17 (from the past 24 hour(s))  ABO/Rh   Collection Time: 04/30/17 11:25 AM  Result Value Ref Range   ABO/RH(D) O POS   [facility]   Collection Time: 04/30/17 11:26 AM  Result Value Ref Range   ABO/RH(D) O POS    Antibody Screen NEG    Sample Expiration 05/03/2017   [facility]   Collection Time: 05/01/17  5:40 AM  Result Value Ref Range   Amnisure ROM NEGATIVE       Medications:  Scheduled . [START ON 05/02/2017] amoxicillin  500 mg Oral Q8H  . azithromycin  500 mg Oral Daily  . docusate sodium  100 mg Oral Daily  . Influenza vac split quadrivalent PF  0.5 mL  Intramuscular Tomorrow-1000  . sodium chloride flush  3 mL Intravenous Q12H   I have reviewed the patient's current medications.  ASSESSMENT: Patient Active Problem List   Diagnosis Date Noted  . Cervical insufficiency during pregnancy, antepartum 04/30/2017  . Supervision of high risk pregnancy, antepartum 03/31/2017  . Advanced maternal age in multigravida, unspecified trimester 03/31/2017  . Dental caries 03/31/2017  . Previous cesarean section 03/31/2017    PLAN: C/W intact membranes, cerclage will be done tomorrow  Scheryl DarterJames Arnold 05/01/2017,6:38 AM

## 2017-05-02 ENCOUNTER — Inpatient Hospital Stay (HOSPITAL_COMMUNITY): Payer: Medicaid Other | Admitting: Anesthesiology

## 2017-05-02 ENCOUNTER — Encounter (HOSPITAL_COMMUNITY)
Admission: RE | Disposition: A | Discharge status: Home / Self Care | Payer: Self-pay | Source: Ambulatory Visit | Attending: Obstetrics & Gynecology

## 2017-05-02 ENCOUNTER — Other Ambulatory Visit: Payer: Self-pay | Admitting: Obstetrics & Gynecology

## 2017-05-02 DIAGNOSIS — O3433 Maternal care for cervical incompetence, third trimester: Secondary | ICD-10-CM

## 2017-05-02 HISTORY — PX: CERVICAL CERCLAGE: SHX1329

## 2017-05-02 SURGERY — CERCLAGE, CERVIX, VAGINAL APPROACH
Anesthesia: Spinal | Site: Vagina

## 2017-05-02 MED ORDER — INDOMETHACIN 50 MG RE SUPP
100.0000 mg | Freq: Three times a day (TID) | RECTAL | Status: DC | PRN
  Filled 2017-05-02: qty 2

## 2017-05-02 MED ORDER — LACTATED RINGERS IV SOLN
INTRAVENOUS | Status: DC
  Administered 2017-05-02: 10:00:00 via INTRAVENOUS

## 2017-05-02 MED ORDER — BUPIVACAINE HCL (PF) 0.5 % IJ SOLN
INTRAMUSCULAR | Status: AC
Start: 2017-05-02 — End: 2017-05-02
  Filled 2017-05-02: qty 30

## 2017-05-02 MED ORDER — PROMETHAZINE HCL 25 MG/ML IJ SOLN
6.2500 mg | INTRAMUSCULAR | Status: DC | PRN
Start: 2017-05-02 — End: 2017-05-02

## 2017-05-02 MED ORDER — OXYCODONE HCL 5 MG PO TABS: 5.0000 mg | ORAL_TABLET | Freq: Once | ORAL | 0 refills | Status: DC | PRN

## 2017-05-02 MED ORDER — HYDROMORPHONE HCL 1 MG/ML IJ SOLN: 0.2500 mg | INTRAMUSCULAR | Status: DC | PRN

## 2017-05-02 MED ORDER — BUPIVACAINE HCL (PF) 0.75 % IJ SOLN
INTRAMUSCULAR | Status: DC | PRN
  Administered 2017-05-02: 1.2 mL via INTRATHECAL

## 2017-05-02 MED ORDER — INDOMETHACIN 25 MG PO CAPS: 50.0000 mg | ORAL_CAPSULE | Freq: Three times a day (TID) | ORAL | Status: AC

## 2017-05-02 MED ORDER — DOCUSATE SODIUM 100 MG PO CAPS: 100.0000 mg | ORAL_CAPSULE | Freq: Every day | ORAL | 0 refills | Status: DC

## 2017-05-02 MED ORDER — INDOMETHACIN 50 MG RE SUPP
100.0000 mg | Freq: Once | RECTAL | Status: AC
  Administered 2017-05-02: 100 mg via RECTAL
  Filled 2017-05-02: qty 2

## 2017-05-02 MED ORDER — OXYCODONE HCL 5 MG PO TABS: 5.0000 mg | ORAL_TABLET | Freq: Once | ORAL | Status: DC | PRN

## 2017-05-02 MED ORDER — OXYCODONE HCL 5 MG/5ML PO SOLN: 5.0000 mg | Freq: Once | ORAL | Status: DC | PRN

## 2017-05-02 SURGICAL SUPPLY — 19 items
CANISTER SUCT 3000ML PPV (MISCELLANEOUS) IMPLANT
GLOVE BIO SURGEON STRL SZ7 (GLOVE) ×3 IMPLANT
GLOVE BIOGEL PI IND STRL 7.0 (GLOVE) ×2 IMPLANT
GLOVE BIOGEL PI INDICATOR 7.0 (GLOVE) ×4
GOWN STRL REUS W/TWL LRG LVL3 (GOWN DISPOSABLE) ×6 IMPLANT
GOWN STRL REUS W/TWL XL LVL3 (GOWN DISPOSABLE) ×3 IMPLANT
NEEDLE MAYO CATGUT SZ4 (NEEDLE) ×3 IMPLANT
PACK VAGINAL MINOR WOMEN LF (CUSTOM PROCEDURE TRAY) ×3 IMPLANT
PAD OB MATERNITY 4.3X12.25 (PERSONAL CARE ITEMS) ×3 IMPLANT
PAD PREP 24X48 CUFFED NSTRL (MISCELLANEOUS) ×3 IMPLANT
SCOPETTES 8  STERILE (MISCELLANEOUS) ×2
SCOPETTES 8 STERILE (MISCELLANEOUS) ×1 IMPLANT
SUT ETHIBOND  5 (SUTURE) ×2
SUT ETHIBOND 5 (SUTURE) ×1 IMPLANT
TOWEL OR 17X24 6PK STRL BLUE (TOWEL DISPOSABLE) ×6 IMPLANT
TRAY FOLEY CATH SILVER 14FR (SET/KITS/TRAYS/PACK) ×3 IMPLANT
TUBING NON-CON 1/4 X 20 CONN (TUBING) IMPLANT
TUBING NON-CON 1/4 X 20' CONN (TUBING)
YANKAUER SUCT BULB TIP NO VENT (SUCTIONS) IMPLANT

## 2017-05-02 NOTE — Anesthesia Procedure Notes (Signed)
Spinal  Patient location during procedure: OR Start time: 05/02/2017 11:45 AM End time: 05/02/2017 11:55 AM Staffing Anesthesiologist: Leonides GrillsEllender, Ryan P, MD Performed: anesthesiologist  Preanesthetic Checklist Completed: patient identified, surgical consent, pre-op evaluation, timeout performed, IV checked, risks and benefits discussed and monitors and equipment checked Spinal Block Patient position: sitting Prep: DuraPrep Patient monitoring: cardiac monitor, continuous pulse ox and blood pressure Approach: midline Location: L4-5 Injection technique: single-shot Needle Needle type: Pencan  Needle gauge: 24 G Needle length: 9 cm Assessment Sensory level: T10 Additional Notes Functioning IV was confirmed and monitors were applied. Sterile prep and drape, including hand hygiene and sterile gloves were used. The patient was positioned and the spine was prepped. The skin was anesthetized with lidocaine.  Free flow of clear CSF was obtained prior to injecting local anesthetic into the CSF.  The spinal needle aspirated freely following injection.  The needle was carefully withdrawn.  The patient tolerated the procedure well.

## 2017-05-02 NOTE — Discharge Instructions (Signed)
Cervical Cerclage, Care After This sheet gives you information about how to care for yourself after your procedure. Your health care provider may also give you more specific instructions. If you have problems or questions, contact your health care provider. What can I expect after the procedure? After your procedure, it is common to have:  Cramping in your abdomen.  Mucus discharge for several days.  Painful urination (dysuria).  Small drops of blood coming from your vagina (spotting).  Follow these instructions at home:  Follow instructions from your health care provider about bed rest, if this applies. You may need to be on bed rest for up to 3 days.  Take over-the-counter and prescription medicines only as told by your health care provider.  Do not drive or use heavy machinery while taking prescription pain medicine.  Keep track of your vaginal discharge and watch for any changes. If you notice changes, tell your health care provider.  Avoid physical activities and exercise until your health care provider approves. Ask your health care provider what activities are safe for you.  Until your health care provider approves: ? Do not douche. ? Do not have sexual intercourse.  Keep all pre-birth (prenatal) visits and all follow-up visits as told by your health care provider. This is important. You will probably have weekly visits to have your cervix checked, and you may need an ultrasound. Contact a health care provider if:  You have abnormal or bad-smelling vaginal discharge, such as clots.  You develop a rash on your skin. This may look like redness and swelling.  You become light-headed or feel like you are going to faint.  You have abdominal pain that does not get better with medicine.  You have persistent nausea or vomiting. Get help right away if:  You have vaginal bleeding that is heavier or more frequent than spotting.  You are leaking fluid or have a gush of fluid  from your vagina (your water breaks).  You have a fever or chills.  You faint.  You have uterine contractions. These may feel like: ? A back ache. ? Lower abdominal pain. ? Mild cramps, similar to menstrual cramps. ? Tightening or pressure in your abdomen.  You think that your baby is not moving as much as usual, or you cannot feel your baby move.  You have chest pain.  You have shortness of breath. This information is not intended to replace advice given to you by your health care provider. Make sure you discuss any questions you have with your health care provider. Document Released: 02/08/2013 Document Revised: 12/18/2015 Document Reviewed: 11/22/2015 Elsevier Interactive Patient Education  2018 [employer]. Cervical Cerclage Cervical cerclage is a surgical procedure to correct a cervix that opens up and thins out before pregnancy is at term (cervical insufficiency, also called incompetent cervix). This condition can cause labor to start early (prematurely). This procedure involves using stitches to sew the cervix shut during pregnancy. Your surgeon may use ultrasound equipment to help guide the procedure and monitor your baby. Ultrasound equipment uses sound waves to take images of your cervix and uterus. Your surgeon will assess these images on a monitor in the operating room. Tell a health care provider about:  Any allergies you have, especially any allergies related to prescribed medicine, stitches, or anesthetic medicines.  All medicines you are taking, including vitamins, herbs, eye drops, creams, and over-the-counter medicines. Bring a list of all of your medicines to your appointment.  Your medical history, including prior  labor deliveries.  Any problems you or family members have had with anesthetic medicines.  Any blood disorders you have.  Any surgeries you have had, including prior cervical stitching.  Any medical conditions you have.  Whether you are pregnant  or may be pregnant. What are the risks? Generally, this is a safe procedure. However, problems may occur, including:  Infection, such as infection of the cervix or amniotic sac.  Vaginal bleeding.  Allergic reactions to medicines.  Damage to other structures or organs, such as tearing (rupture) of membranes or cervical laceration.  Premature contractions including going into early labor and delivery.  Cervical dystocia, which occurs when the cervix is unable to dilate normally during labor.  What happens before the procedure? Staying hydrated Follow instructions from your health care provider about hydration, which may include:  Up to 2 hours before the procedure - you may continue to drink clear liquids, such as water, clear fruit juice, black coffee, and plain tea.  Eating and drinking restrictions Follow instructions from your health care provider about eating and drinking, which may include:  8 hours before the procedure - stop eating heavy meals or foods such as meat, fried foods, or fatty foods.  6 hours before the procedure - stop eating light meals or foods, such as toast or cereal.  6 hours before the procedure - stop drinking milk or drinks that contain milk.  2 hours before the procedure - stop drinking clear liquids.  Medicines  Ask your health care provider about: ? Changing or stopping your regular medicines. This is especially important if you are taking diabetes medicines or blood thinners. ? Taking medicines such as aspirin and ibuprofen. These medicines can thin your blood. Do not take these medicines before your procedure if your health care provider instructs you not to.  You may be given antibiotic medicine to help prevent infection. General instructions  Do not put on any lotion, deodorant, or perfume.  Remove contact lenses and jewelry.  Ask your health care provider how your surgical site will be marked or identified.  You may have an exam or  testing.  You may have a blood or urine sample taken.  Plan to have someone take you home from the hospital or clinic.  If you will be going home right after the procedure, plan to have someone with you for 24 hours. What happens during the procedure?  To reduce your risk of infection: ? Your health care team will wash or sanitize their hands. ? Your skin will be washed with soap.  An IV tube will be inserted into one of your veins.  You may be given one or more of the following: ? A medicine to help you relax (sedative). ? A medicine to numb the area (local anesthetic). ? A medicine to make you fall asleep (general anesthetic). ? A medicine that is injected into your spine to numb the area below and slightly above the injection site (spinal anesthetic). ? A medicine that is injected into an area of your body to numb everything below the injection site (regional anesthetic).  A lubricated instrument (speculum) will be inserted into your vagina. The speculum will be widened to open the walls of your vagina so your surgeon can see your cervix.  Your cervix will be grasped and tightly stitched closed (sutured). To do this, your surgeon will stitch a strong band of thread around your cervix, then the thread will be tightened to hold your cervix shut. The  procedure may vary among health care providers and hospitals. What happens after the procedure?  Your blood pressure, heart rate, breathing rate, and blood oxygen level will be monitored until the medicines you were given have worn off. You will be monitored for premature contractions.  You may have light bleeding and mild cramping.  You may have to wear compression stockings. These stockings help to prevent blood clots and reduce swelling in your legs.  Do not drive for 24 hours if you received a sedative.  You may be put on bed rest.  You may be given medicine to prevent infection.  You may be given an injection of a hormone  (progesterone) to prevent your uterus from tightening (contracting). Summary  Cervical cerclage is a surgical procedure that involves using stitches to sew the cervix shut during pregnancy.  Your blood pressure, heart rate, breathing rate, and blood oxygen level will be monitored until the medicines you were given have worn off. You will be monitored for premature contractions.  You may need to be on bed rest after the procedure.  Plan to have someone take you home from the hospital or clinic. This information is not intended to replace advice given to you by your health care provider. Make sure you discuss any questions you have with your health care provider. Document Released: 04/02/2008 Document Revised: 12/13/2015 Document Reviewed: 12/05/2015 Elsevier Interactive Patient Education  [employer]2018 Elsevier Inc[employer]

## 2017-05-02 NOTE — Op Note (Signed)
05/02/2017  12:34 PM  PATIENT:  Brandi Wong  36 y.o. female  PRE-OPERATIVE DIAGNOSIS:  Incompetent Cervix  POST-OPERATIVE DIAGNOSIS:  Incompetent Cervix  PROCEDURE:  Procedure(s): CERCLAGE CERVICAL (N/A)  SURGEON:  Surgeon(s) and Role:    * Willodean RosenthalHarraway-Smith, Kaiel Weide, MD - Primary  ANESTHESIA:   spinal  EBL:  5 mL   BLOOD ADMINISTERED:none  DRAINS: none   LOCAL MEDICATIONS USED:  NONE  SPECIMEN:  No Specimen  DISPOSITION OF SPECIMEN:  N/A  COUNTS:  YES  TOURNIQUET:  * No tourniquets in log *  DICTATION: .Note written in EPIC  PLAN OF CARE: Pt has active admission order  PATIENT DISPOSITION:  PACU - hemodynamically stable.   Delay start of Pharmacological VTE agent (>24hrs) due to surgical blood loss or risk of bleeding: yes  Complications: none immediate  INDICATIONS: 36 y.o. G2P1001 at 4738w2d with history of cervical incompetence, here for cerclage placement.   The risks of surgery were discussed in detail with the patient including but not limited to: bleeding; infection which may require antibiotic therapy; injury to cervix, vagina other surrounding organs; risk of ruptured membranes and/or preterm delivery and other postoperative or anesthesia complications.  Written informed consent was obtained.    FINDINGS:  About 2 cm palpable cervical length in the vagina, closed cervix, suture knot placed at 9 o'clock.  PROCEDURE IN DETAIL:  The patient received intravenous antibiotics this am and had sequential compression devices applied to her lower extremities while in the preoperative area.  Reassuring fetal heart rate was also obtained using a doppler. She was then taken to the operating room where spinal anesthesia was administered and was found to be adequate.  She was placed in the dorsal lithotomy, and was prepped and draped in a sterile manner. Her bladder was catheterized for an unmeasured amount of clear, yellow urine. After an adequate timeout was performed, a  vaginal speculum was then placed in the patient's vagina and a single tooth tenaculum was applied to the anterior lip of the cervix.    The anterior and posterior lips of the cervix was grasped with ring forceps. A curved needle loaded with a number 5 Ethibond suture was inserted at 12 o'clock, as high as possible at the junction of the rugated vaginal epithelium and the smooth cervix, at least 2 cm above the external os.  Four bites are taken circumferentially around the entire cervix in a purse-string fashion, each bite should be deep enough to extend at least midway into the cervical stroma, but not into the endocervical canal. The two ends of the suture were then tied securely anteriorly and cut, leaving the ends long enough to grasp with a clamp when it is time to remove it. There was minimal bleeding noted and the ring forceps were removed with good hemostasis noted.  All instruments were removed from the patient's vagina.   Indomethacin 100 mg rectal suppository was placed.  Instrument, needle and sponge counts were correct x 2. The patient tolerated the procedure well, and was taken to the recovery area awake and in stable condition. Reassuring fetal heart rate was also obtained using a doppler in the recovery area.  [name] L. Harraway-Smith, M.D., Evern CoreFACOG

## 2017-05-02 NOTE — Progress Notes (Signed)
Discharge instructions given to patient and she verbalized understanding of all instructions given. Written copy of AVS given to patient. Patient declined services of translator.

## 2017-05-02 NOTE — Progress Notes (Signed)
To OR via strecher.

## 2017-05-02 NOTE — Anesthesia Postprocedure Evaluation (Signed)
Anesthesia Post Note  Patient: Brandi Wong  Procedure(s) Performed: CERCLAGE CERVICAL (N/A Vagina )     Patient location during evaluation: PACU Anesthesia Type: Spinal Level of consciousness: oriented and awake and alert Pain management: pain level controlled Vital Signs Assessment: post-procedure vital signs reviewed and stable Respiratory status: spontaneous breathing, respiratory function stable and patient connected to nasal cannula oxygen Cardiovascular status: blood pressure returned to baseline and stable Postop Assessment: no headache, no backache and no apparent nausea or vomiting Anesthetic complications: no    Last Vitals:  Vitals:   05/02/17 1423 05/02/17 1533  BP: (!) 109/56 106/77  Pulse: 75 87  Resp: 18   Temp: 37.2 C 36.9 C  SpO2:  100%    Last Pain:  Vitals:   05/02/17 1533  TempSrc: Axillary  PainSc:    Pain Goal: Patients Stated Pain Goal: 4 (05/02/17 0100)               Alycia Rossettiyan P Ellender

## 2017-05-02 NOTE — Transfer of Care (Signed)
Immediate Anesthesia Transfer of Care Note  Patient: Brandi Wong  Procedure(s) Performed: CERCLAGE CERVICAL (N/A Vagina )  Patient Location: PACU  Anesthesia Type:Spinal  Level of Consciousness: awake  Airway & Oxygen Therapy: Patient Spontanous Breathing  Post-op Assessment: Report given to RN  Post vital signs: Reviewed and stable  Last Vitals:  Vitals:   05/02/17 0700 05/02/17 1128  BP: 95/60 (!) 111/59  Pulse: 75 80  Resp: 16 17  Temp: 36.9 C 37.1 C  SpO2: 100% 100%    Last Pain:  Vitals:   05/02/17 1128  TempSrc: Oral  PainSc:       Patients Stated Pain Goal: 4 (05/02/17 0100)  Complications: No apparent anesthesia complications

## 2017-05-02 NOTE — Progress Notes (Signed)
Patient ID: Brandi Wong, female   DOB: 1980/12/08, 36 y.o.   MRN: [id number] ACULTY PRACTICE ANTEPARTUM COMPREHENSIVE PROGRESS NOTE  Brandi Wong is a 36 y.o. G2P1001 at 8763w2d  who is admitted for cervical insufficiency   possible ROM.  Length of Stay:  2  Days  Subjective: Pt reports no LOF or vaginal bleeding since the procedure. Her Amnisure was neg.  She denies pain or other problems.   Vitals:  Blood pressure 95/60, pulse 75, temperature 98.5 F (36.9 C), temperature source Oral, resp. rate 16, height 5\' 4"  (1.626 m), weight 154 lb 9 oz (70.1 kg), last menstrual period 12/18/2016, SpO2 100 %. Physical Examination: General appearance - alert, well appearing, and in no distress Abdomen - soft, nontender, nondistended, no masses or organomegaly Extremities - no pedal edema noted Cervical Exam: Not evaluated. Membranes:intact  Fetal Monitoring:  positive  Labs:  No results found for this or any previous visit (from the past 24 hour(s)). 05/01/2017 Amnisure neg  Imaging Studies:    04/30/2017 Impression  SIUP at 4310w0d  active fetus  amniotic fluid volume is gestational age appropriate  no previa  cervix is funneled but not technically open by transabdominal  ultrasound  today's findings by ultrasound do not confirm preterm  premature rupture of membranes or disprove it.  However, in  absence of a positive amnisure, positive ferning, or positive  nitrazine, I question whether the vaginal pooling seen in the  OR was actually amniotic fluid versus urine. ---------------------------------------------------------------------- Recommendations  I spoke with Dr. Erin FullingHarraway-Smith regarding my  impressions/recommendations as follows:  -I feel it is reasonable to continue latency antibiotics to  increase chances of successful ultrasound indicated/rescue  cerclage as now recommended by some experts.  -I would repeat the speculum exam tomorrow to facilitate  collection of Amnisure  and reassessment for  pooling/ferning/nitrazine.  -If speculum exam remains negative and Amnisure is  negative, then I feel that it seems unlikely she has pPROM  and remains as an excellent candidate for cerclage provided  no infection/labor signs/symptoms are noted in the interim.  Medications:  Scheduled . amoxicillin  500 mg Oral Q8H  . azithromycin  500 mg Oral Daily  . docusate sodium  100 mg Oral Daily  . sodium chloride flush  3 mL Intravenous Q12H   I have reviewed the patient's current medications.  ASSESSMENT: Patient Active Problem List   Diagnosis Date Noted  . Cervical insufficiency during pregnancy, antepartum 04/30/2017  . Supervision of high risk pregnancy, antepartum 03/31/2017  . Advanced maternal age in multigravida, unspecified trimester 03/31/2017  . Dental caries 03/31/2017  . Previous cesarean section 03/31/2017    PLAN: To OR for McDonald cerclage when ready  Reco Shonk Harraway-Smith 05/02/2017,9:26 AM

## 2017-05-02 NOTE — Brief Op Note (Signed)
05/02/2017  12:34 PM  PATIENT:  Brandi Wong  36 y.o. female  PRE-OPERATIVE DIAGNOSIS:  Incompetent Cervix  POST-OPERATIVE DIAGNOSIS:  Incompetent Cervix  PROCEDURE:  Procedure(s): CERCLAGE CERVICAL (N/A)  SURGEON:  Surgeon(s) and Role:    * Willodean RosenthalHarraway-Smith, Felisia Balcom, MD - Primary  ANESTHESIA:   spinal  EBL:  5 mL   BLOOD ADMINISTERED:none  DRAINS: none   LOCAL MEDICATIONS USED:  NONE  SPECIMEN:  No Specimen  DISPOSITION OF SPECIMEN:  N/A  COUNTS:  YES  TOURNIQUET:  * No tourniquets in log *  DICTATION: .Note written in EPIC  PLAN OF CARE: Pt has active admission order  PATIENT DISPOSITION:  PACU - hemodynamically stable.   Delay start of Pharmacological VTE agent (>24hrs) due to surgical blood loss or risk of bleeding: yes  Complications: none immediate  Mitul Hallowell L. Harraway-Smith, M.D., Evern CoreFACOG

## 2017-05-02 NOTE — Discharge Summary (Signed)
Antenatal Physician Discharge Summary  Patient ID: Brandi Wong MRN: [id number] DOB/AGE: June 10, 1980 36 y.o.  Admit date: 04/30/2017 Discharge date: 05/02/2017  Admission Diagnoses: cervical insufficiency; possible ROM   Discharge Diagnoses: cervical insufficiency  Prenatal Procedures: cerclage  Significant Diagnostic Studies:  Results for orders placed or performed during the hospital encounter of 04/30/17 (from the past 168 hour(s))  ABO/Rh   Collection Time: 04/30/17 11:25 AM  Result Value Ref Range   ABO/RH(D) O POS   [facility] and screen [facility]   Collection Time: 04/30/17 11:26 AM  Result Value Ref Range   ABO/RH(D) O POS    Antibody Screen NEG    Sample Expiration 05/03/2017   [facility]   Collection Time: 05/01/17  5:40 AM  Result Value Ref Range   Amnisure ROM NEGATIVE   Results for orders placed or performed in visit on 04/29/17 (from the past 168 hour(s))  Obstetric Panel, Including HIV   Collection Time: 04/29/17  9:33 AM  Result Value Ref Range   Hepatitis B Surface Ag Negative Negative   RPR Ser Ql Non Reactive Non Reactive   Rubella Antibodies, IGG 3.72 Immune >0.99 index   ABO Grouping O    Rh Factor Positive    Antibody Screen Negative Negative   HIV Screen 4th Generation wRfx Non Reactive Non Reactive   WBC 9.5 3.4 - 10.8 x10E3/uL   RBC 3.35 (L) 3.77 - 5.28 x10E6/uL   Hemoglobin 10.6 (L) 11.1 - 15.9 g/dL   Hematocrit 11.931.3 (L) 14.734.0 - 46.6 %   MCV 93 79 - 97 fL   MCH 31.6 26.6 - 33.0 pg   MCHC 33.9 31.5 - 35.7 g/dL   RDW 82.915.2 56.212.3 - 13.015.4 %   Platelets 238 150 - 379 x10E3/uL   Neutrophils 81 Not Estab. %   Lymphs 13 Not Estab. %   Monocytes 5 Not Estab. %   Eos 1 Not Estab. %   Basos 0 Not Estab. %   Neutrophils Absolute 7.8 (H) 1.4 - 7.0 x10E3/uL   Lymphocytes Absolute 1.2 0.7 - 3.1 x10E3/uL   Monocytes Absolute 0.4 0.1 - 0.9 x10E3/uL   EOS (ABSOLUTE) 0.1 0.0 - 0.4 x10E3/uL   Basophils  Absolute 0.0 0.0 - 0.2 x10E3/uL   Immature Granulocytes 0 Not Estab. %   Immature Grans (Abs) 0.0 0.0 - 0.1 x10E3/uL  Urine Culture   Collection Time: 04/29/17 10:43 AM  Result Value Ref Range   Urine Culture, Routine Final report    Organism ID, Bacteria No growth     Treatments: surgery: McDonald Cerclage  Hospital Course:  This is a 36 y.o. G2P1001 with IUP at 4923w2d admitted for possible ROM after a cancelled cerclage. Pts fern and amnisure was negative and she had a McDonald cerclage 12/30.  Please see op note for full report. No leaking of fluid and no bleeding.  She was deemed stable for discharge to home with outpatient follow up.  Discharge Exam: BP 106/77 (BP Location: Left Arm)   Pulse 87   Temp 98.5 F (36.9 C) (Axillary) Comment: eating  Resp 18   Ht 5\' 4"  (1.626 m)   Wt 154 lb 9 oz (70.1 kg)   LMP 12/18/2016 (Exact Date)   SpO2 100%   BMI 26.53 kg/m  General appearance: alert and no distress  Discharge Condition: good  Disposition: discharge to home  Discharge Instructions    Discharge activity:   Complete by:  As directed  No heavy lifting   Discharge diet:  No restrictions   Complete by:  As directed    Notify physician for a general feeling that "something is not right"   Complete by:  As directed    Notify physician for increase or change in vaginal discharge   Complete by:  As directed    Notify physician for intestinal cramps, with or without diarrhea, sometimes described as "gas pain"   Complete by:  As directed    Notify physician for leaking of fluid   Complete by:  As directed    Notify physician for low, dull backache, unrelieved by heat or Tylenol   Complete by:  As directed    Notify physician for menstrual like cramps   Complete by:  As directed    Notify physician for pelvic pressure   Complete by:  As directed    Notify physician for uterine contractions.  These may be painless and feel like the uterus is tightening or the baby is   "balling up"   Complete by:  As directed    Notify physician for vaginal bleeding   Complete by:  As directed    Sexual Activity:     Complete by:  As directed    NOTHING in the vagina     Allergies as of 05/02/2017   No Known Allergies     Medication List    TAKE these medications   docusate sodium 100 MG capsule Commonly known as:  COLACE Take 1 capsule (100 mg total) by mouth daily. Start taking on:  05/03/2017   oxyCODONE 5 MG immediate release tablet Commonly known as:  Oxy IR/ROXICODONE Take 1 tablet (5 mg total) by mouth once as needed (for pain score of 1-4).   prenatal vitamin w/FE, FA 27-1 MG Tabs tablet Take 1 tablet by mouth daily.      Follow-up Information    Willodean RosenthalHarraway-Smith, Janari Yamada, MD Follow up in 2 week(s).   Specialty:  Obstetrics and Gynecology Contact information: [address] [place] [address] [phone]           Signed: Willodean RosenthalCarolyn Harraway-Smith M.D. 05/02/2017, 4:25 PM

## 2017-05-02 NOTE — Anesthesia Preprocedure Evaluation (Signed)
Anesthesia Evaluation  Patient identified by MRN, date of birth, ID band Patient awake    Reviewed: Allergy & Precautions, NPO status , Patient's Chart, lab work & pertinent test results  Airway Mallampati: II  TM Distance: >3 FB Neck ROM: Full    Dental  (+) Teeth Intact, Dental Advisory Given   Pulmonary neg pulmonary ROS,    Pulmonary exam normal breath sounds clear to auscultation       Cardiovascular Exercise Tolerance: Good negative cardio ROS Normal cardiovascular exam Rhythm:Regular Rate:Normal     Neuro/Psych negative neurological ROS  negative psych ROS   GI/Hepatic negative GI ROS, Neg liver ROS,   Endo/Other  negative endocrine ROS  Renal/GU negative Renal ROS     Musculoskeletal negative musculoskeletal ROS (+)   Abdominal   Peds  Hematology  (+) Blood dyscrasia, anemia , Plt 238k   Anesthesia Other Findings Incompetent Cervix  Reproductive/Obstetrics (+) Pregnancy                             Anesthesia Physical  Anesthesia Plan  ASA: II  Anesthesia Plan: Spinal   Post-op Pain Management:    Induction:   PONV Risk Score and Plan: 2 and Ondansetron and Treatment may vary due to age or medical condition  Airway Management Planned: Natural Airway  Additional Equipment:   Intra-op Plan:   Post-operative Plan:   Informed Consent: I have reviewed the patients History and Physical, chart, labs and discussed the procedure including the risks, benefits and alternatives for the proposed anesthesia with the patient or authorized representative who has indicated his/her understanding and acceptance.   Dental advisory given  Plan Discussed with: CRNA, Anesthesiologist and Surgeon  Anesthesia Plan Comments: (Discussed risks and benefits of and differences between spinal and general. Discussed risks of spinal including headache, backache, failure, bleeding, infection, and  nerve damage. Patient consents to spinal. Questions answered. Coagulation studies and platelet count acceptable.)        Anesthesia Quick Evaluation

## 2017-05-03 ENCOUNTER — Encounter (HOSPITAL_COMMUNITY): Payer: Self-pay | Admitting: Obstetrics & Gynecology

## 2017-05-05 ENCOUNTER — Other Ambulatory Visit: Payer: Self-pay

## 2017-05-05 ENCOUNTER — Inpatient Hospital Stay (HOSPITAL_COMMUNITY)
Admission: AD | Admit: 2017-05-05 | Discharge: 2017-05-05 | Disposition: A | Discharge status: Home / Self Care | Payer: Medicaid Other | Source: Ambulatory Visit | Attending: Obstetrics & Gynecology | Admitting: Obstetrics & Gynecology

## 2017-05-05 ENCOUNTER — Encounter (HOSPITAL_COMMUNITY): Payer: Self-pay

## 2017-05-05 DIAGNOSIS — Z3A19 19 weeks gestation of pregnancy: Secondary | ICD-10-CM | POA: Diagnosis not present

## 2017-05-05 DIAGNOSIS — O3430 Maternal care for cervical incompetence, unspecified trimester: Secondary | ICD-10-CM

## 2017-05-05 DIAGNOSIS — R51 Headache: Secondary | ICD-10-CM

## 2017-05-05 DIAGNOSIS — O219 Vomiting of pregnancy, unspecified: Secondary | ICD-10-CM | POA: Diagnosis not present

## 2017-05-05 DIAGNOSIS — O26899 Other specified pregnancy related conditions, unspecified trimester: Secondary | ICD-10-CM | POA: Diagnosis present

## 2017-05-05 DIAGNOSIS — R1110 Vomiting, unspecified: Secondary | ICD-10-CM | POA: Diagnosis present

## 2017-05-05 DIAGNOSIS — R519 Headache, unspecified: Secondary | ICD-10-CM | POA: Diagnosis present

## 2017-05-05 DIAGNOSIS — O3432 Maternal care for cervical incompetence, second trimester: Secondary | ICD-10-CM | POA: Diagnosis not present

## 2017-05-05 LAB — URINALYSIS, ROUTINE W REFLEX MICROSCOPIC
Bilirubin Urine: NEGATIVE
Glucose, UA: NEGATIVE mg/dL
Hgb urine dipstick: NEGATIVE
Ketones, ur: 80 mg/dL — AB
LEUKOCYTES UA: NEGATIVE
NITRITE: NEGATIVE
Protein, ur: NEGATIVE mg/dL
SPECIFIC GRAVITY, URINE: 1.012 (ref 1.005–1.030)
pH: 7 (ref 5.0–8.0)

## 2017-05-05 MED ORDER — DEXTROSE 5 % IN LACTATED RINGERS IV BOLUS
1000.0000 mL | Freq: Once | INTRAVENOUS | Status: AC
  Administered 2017-05-05: 1000 mL via INTRAVENOUS

## 2017-05-05 MED ORDER — METOCLOPRAMIDE HCL 5 MG/ML IJ SOLN
10.0000 mg | Freq: Once | INTRAMUSCULAR | Status: AC
  Administered 2017-05-05: 10 mg via INTRAVENOUS
  Filled 2017-05-05: qty 2

## 2017-05-05 MED ORDER — PROMETHAZINE HCL 25 MG PO TABS
25.0000 mg | ORAL_TABLET | Freq: Four times a day (QID) | ORAL | 2 refills | Status: DC | PRN
Start: 2017-05-05 — End: 2017-08-17

## 2017-05-05 MED ORDER — BUTALBITAL-APAP-CAFFEINE 50-325-40 MG PO TABS: 1.0000 | ORAL_TABLET | Freq: Four times a day (QID) | ORAL | 0 refills | Status: DC | PRN

## 2017-05-05 MED ORDER — DIPHENHYDRAMINE HCL 50 MG/ML IJ SOLN
25.0000 mg | INTRAMUSCULAR | Status: AC
  Administered 2017-05-05: 25 mg via INTRAVENOUS
  Filled 2017-05-05: qty 1

## 2017-05-05 MED ORDER — ONDANSETRON 8 MG PO TBDP: 8.0000 mg | ORAL_TABLET | Freq: Once | ORAL | Status: DC

## 2017-05-05 MED ORDER — DEXAMETHASONE SODIUM PHOSPHATE 10 MG/ML IJ SOLN
10.0000 mg | INTRAMUSCULAR | Status: AC
  Administered 2017-05-05: 10 mg via INTRAVENOUS
  Filled 2017-05-05: qty 1

## 2017-05-05 MED ORDER — FAMOTIDINE 20 MG PO TABS: 20.0000 mg | ORAL_TABLET | Freq: Two times a day (BID) | ORAL | 2 refills | Status: DC

## 2017-05-05 MED ORDER — DEXTROSE IN LACTATED RINGERS 5 % IV SOLN
25.0000 mg | Freq: Once | INTRAVENOUS | Status: DC
  Filled 2017-05-05: qty 1

## 2017-05-05 MED ORDER — SODIUM CHLORIDE 0.9 % IV SOLN
Freq: Once | INTRAVENOUS | Status: AC
  Administered 2017-05-05: 20:00:00 via INTRAVENOUS
  Filled 2017-05-05: qty 1000

## 2017-05-05 NOTE — Discharge Instructions (Signed)

## 2017-05-05 NOTE — MAU Provider Note (Signed)
History     CSN: [id number]  Arrival date and time: 05/05/17 1626   First Provider Initiated Contact with Patient 05/05/17 1850      Chief Complaint  Patient presents with  . Headache  . Emesis   HPI  Ms.  Brandi Wong is a 37 y.o. year old G65P1001 female at [redacted]w[redacted]d weeks gestation who presents to MAU reporting H/A that started Monday and now causes N/V. She took Percocet without any relief. She had a cerclage placed Sunday. She denies any abdominal pain or VB.  Past Medical History:  Diagnosis Date  . Medical history non-contributory     Past Surgical History:  Procedure Laterality Date  . CERVICAL CERCLAGE N/A 04/30/2017   Procedure: CERCLAGE CERVICAL;  Surgeon: Willodean Rosenthal, MD;  Location: [facility];  Service: Gynecology;  Laterality: N/A;  . CERVICAL CERCLAGE N/A 05/02/2017   Procedure: CERCLAGE CERVICAL;  Surgeon: Willodean Rosenthal, MD;  Location: [facility];  Service: Gynecology;  Laterality: N/A;  . CESAREAN SECTION      History reviewed. No pertinent family history.  Social History   Tobacco Use  . Smoking status: Never Smoker  . Smokeless tobacco: Never Used  Substance Use Topics  . Alcohol use: No    Frequency: Never  . Drug use: No    Allergies: No Known Allergies  Medications Prior to Admission  Medication Sig Dispense Refill Last Dose  . docusate sodium (COLACE) 100 MG capsule Take 1 capsule (100 mg total) by mouth daily. 10 capsule 0   . oxyCODONE (OXY IR/ROXICODONE) 5 MG immediate release tablet Take 1 tablet (5 mg total) by mouth once as needed (for pain score of 1-4). 15 tablet 0   . prenatal vitamin w/FE, FA (PRENATAL 1 + 1) 27-1 MG TABS tablet Take 1 tablet by mouth daily. 30 each 11 04/29/2017 at Unknown time    Review of Systems  Constitutional: Negative.   HENT: Negative.   Eyes: Negative.   Respiratory: Negative.   Gastrointestinal: Positive for abdominal pain, nausea and vomiting.  Endocrine: Negative.   Genitourinary:  Negative.   Musculoskeletal: Negative.   Skin: Negative.   Allergic/Immunologic: Negative.   Neurological: Positive for headaches.  Hematological: Negative.   Psychiatric/Behavioral: Negative.    Physical Exam   Blood pressure 110/63, pulse 94, temperature 98.1 F (36.7 C), temperature source Oral, resp. rate 18, weight 153 lb 8 oz (69.6 kg), last menstrual period 12/18/2016, SpO2 100 %.  Physical Exam  Nursing note and vitals reviewed. Genitourinary:  Genitourinary Comments: deferred    MAU Course  Procedures  MDM CCUA FHTs by doppler: 150 bpm H/A Protocol -- improving H/A after 30 mins of administration  Results for orders placed or performed during the hospital encounter of 05/05/17 (from the past 24 hour(s))  Urinalysis, Routine w reflex microscopic     Status: Abnormal   Collection Time: 05/05/17  5:21 PM  Result Value Ref Range   Color, Urine YELLOW YELLOW   APPearance HAZY (A) CLEAR   Specific Gravity, Urine 1.012 1.005 - 1.030   pH 7.0 5.0 - 8.0   Glucose, UA NEGATIVE NEGATIVE mg/dL   Hgb urine dipstick NEGATIVE NEGATIVE   Bilirubin Urine NEGATIVE NEGATIVE   Ketones, ur 80 (A) NEGATIVE mg/dL   Protein, ur NEGATIVE NEGATIVE mg/dL   Nitrite NEGATIVE NEGATIVE   Leukocytes, UA NEGATIVE NEGATIVE   Report given to and care assumed by Cleone Slim, CNM @ [phone] Patient reports no pain, no nausea or vomiting.   Assessment  and Plan   1. Nausea and vomiting during pregnancy prior to [redacted] weeks gestation   2. Cervical insufficiency during pregnancy, antepartum   3. [redacted] weeks gestation of pregnancy    -Discharge home in stable condition -Rx for pepcid, phenergan, and fioricet given to patient -Patient [facility] -Patient may return to MAU as needed or if her condition were to change or worsen  Rolm BookbinderCaroline M Madgie Dhaliwal, [place] 05/05/17 9:11 PM

## 2017-05-05 NOTE — MAU Note (Signed)
Cerclage placed on Sunday. Headache started on Monday. Took oxycodone, did not help. Threw up the last dose.  Started vomiting today.

## 2017-05-06 ENCOUNTER — Other Ambulatory Visit (HOSPITAL_COMMUNITY): Payer: Self-pay | Admitting: *Deleted

## 2017-05-06 ENCOUNTER — Ambulatory Visit (HOSPITAL_COMMUNITY)
Admission: RE | Admit: 2017-05-06 | Discharge: 2017-05-06 | Disposition: A | Discharge status: Home / Self Care | Payer: Medicaid Other | Source: Ambulatory Visit | Attending: Nurse Practitioner | Admitting: Nurse Practitioner

## 2017-05-06 ENCOUNTER — Encounter (HOSPITAL_COMMUNITY): Payer: Self-pay

## 2017-05-06 DIAGNOSIS — O3432 Maternal care for cervical incompetence, second trimester: Secondary | ICD-10-CM | POA: Diagnosis present

## 2017-05-06 DIAGNOSIS — Z3A19 19 weeks gestation of pregnancy: Secondary | ICD-10-CM | POA: Insufficient documentation

## 2017-05-06 DIAGNOSIS — O0990 Supervision of high risk pregnancy, unspecified, unspecified trimester: Secondary | ICD-10-CM

## 2017-05-10 ENCOUNTER — Encounter: Payer: Self-pay | Admitting: *Deleted

## 2017-05-18 ENCOUNTER — Ambulatory Visit (INDEPENDENT_AMBULATORY_CARE_PROVIDER_SITE_OTHER): Payer: Medicaid Other | Admitting: Obstetrics & Gynecology

## 2017-05-18 VITALS — BP 114/54 | HR 87 | Wt 153.6 lb

## 2017-05-18 DIAGNOSIS — O3430 Maternal care for cervical incompetence, unspecified trimester: Secondary | ICD-10-CM

## 2017-05-18 DIAGNOSIS — O0992 Supervision of high risk pregnancy, unspecified, second trimester: Secondary | ICD-10-CM

## 2017-05-18 DIAGNOSIS — O0990 Supervision of high risk pregnancy, unspecified, unspecified trimester: Secondary | ICD-10-CM

## 2017-05-18 DIAGNOSIS — O26892 Other specified pregnancy related conditions, second trimester: Secondary | ICD-10-CM

## 2017-05-18 DIAGNOSIS — Z98891 History of uterine scar from previous surgery: Secondary | ICD-10-CM

## 2017-05-18 DIAGNOSIS — K029 Dental caries, unspecified: Secondary | ICD-10-CM

## 2017-05-18 DIAGNOSIS — R51 Headache: Secondary | ICD-10-CM

## 2017-05-18 DIAGNOSIS — O219 Vomiting of pregnancy, unspecified: Secondary | ICD-10-CM

## 2017-05-18 DIAGNOSIS — O26899 Other specified pregnancy related conditions, unspecified trimester: Secondary | ICD-10-CM

## 2017-05-18 NOTE — Patient Instructions (Signed)
Cervical Cerclage, Care After  This sheet gives you information about how to care for yourself after your procedure. Your health care provider may also give you more specific instructions. If you have problems or questions, contact your health care provider.  What can I expect after the procedure?  After your procedure, it is common to have:   Cramping in your abdomen.   Mucus discharge for several days.   Painful urination (dysuria).   Small drops of blood coming from your vagina (spotting).    Follow these instructions at home:   Follow instructions from your health care provider about bed rest, if this applies. You may need to be on bed rest for up to 3 days.   Take over-the-counter and prescription medicines only as told by your health care provider.   Do not drive or use heavy machinery while taking prescription pain medicine.   Keep track of your vaginal discharge and watch for any changes. If you notice changes, tell your health care provider.   Avoid physical activities and exercise until your health care provider approves. Ask your health care provider what activities are safe for you.   Until your health care provider approves:  ? Do not douche.  ? Do not have sexual intercourse.   Keep all pre-birth (prenatal) visits and all follow-up visits as told by your health care provider. This is important. You will probably have weekly visits to have your cervix checked, and you may need an ultrasound.    Contact a health care provider if:   You have abnormal or bad-smelling vaginal discharge, such as clots.   You develop a rash on your skin. This may look like redness and swelling.   You become light-headed or feel like you are going to faint.   You have abdominal pain that does not get better with medicine.   You have persistent nausea or vomiting.    Get help right away if:   You have vaginal bleeding that is heavier or more frequent than spotting.   You are leaking fluid or have a gush of  fluid from your vagina (your water breaks).   You have a fever or chills.   You faint.   You have uterine contractions. These may feel like:  ? A back ache.  ? Lower abdominal pain.  ? Mild cramps, similar to menstrual cramps.  ? Tightening or pressure in your abdomen.   You think that your baby is not moving as much as usual, or you cannot feel your baby move.   You have chest pain.   You have shortness of breath.  This information is not intended to replace advice given to you by your health care provider. Make sure you discuss any questions you have with your health care provider.    Document Released: 02/08/2013 Document Revised: 12/18/2015 Document Reviewed: 11/22/2015  Elsevier Interactive Patient Education  2018 [employer]

## 2017-05-18 NOTE — Progress Notes (Signed)
   PRENATAL VISIT NOTE  Subjective:  Brandi Wong is a 37 y.o. G2P1001 at 6238w4d being seen today for ongoing prenatal care.  She is currently monitored for the following issues for this high-risk pregnancy and has Supervision of high risk pregnancy, antepartum; Advanced maternal age in multigravida, unspecified trimester; Dental caries; Previous cesarean section; Cervical insufficiency during pregnancy, antepartum; Headache in pregnancy; and Nausea/vomiting in pregnancy on their problem list.  Patient reports no complaints.  Contractions: Not present. Vag. Bleeding: None.  Movement: Present. Denies leaking of fluid.   The following portions of the patient's history were reviewed and updated as appropriate: allergies, current medications, past family history, past medical history, past social history, past surgical history and problem list. Problem list updated.  Objective:   Vitals:   05/18/17 1014  BP: (!) 114/54  Pulse: 87  Weight: 153 lb 9.6 oz (69.7 kg)    Fetal Status: Fetal Heart Rate (bpm): 156   Movement: Present     General:  Alert, oriented and cooperative. Patient is in no acute distress.  Skin: Skin is warm and dry. No rash noted.   Cardiovascular: Normal heart rate noted  Respiratory: Normal respiratory effort, no problems with respiration noted  Abdomen: Soft, gravid, appropriate for gestational age.  Pain/Pressure: Absent     Pelvic: Cervical exam deferred        Extremities: Normal range of motion.  Edema: None  Mental Status:  Normal mood and affect. Normal behavior. Normal judgment and thought content.   Assessment and Plan:  Pregnancy: G2P1001 at 338w4d  1. Supervision of high risk pregnancy, antepartum   2. Previous cesarean section Declines VBAC requests scheduled c/s at 39 weeks   3. Nausea/vomiting in pregnancy Improved  4. Pregnancy headache, antepartum Resolved.   5. Dental caries  6. Cervical insufficiency during pregnancy, antepartum S/p  cerclage. No problem s 05/06/2017 CL 1.1cm  Funneling to cerclage.  Repeat US in 1 1/2 week  Preterm labor symptoms and general obstetric precautions including but not limited to vaginal bleeding, contractions, leaking of fluid and fetal movement were reviewed in detail with the patient. Please refer to After Visit Summary for other counseling recommendations.  No Follow-up on file.   Willodean Rosenthalarolyn Harraway-Smith, MD

## 2017-05-20 ENCOUNTER — Encounter (HOSPITAL_COMMUNITY): Payer: Self-pay

## 2017-05-20 ENCOUNTER — Ambulatory Visit (HOSPITAL_COMMUNITY)
Admission: RE | Admit: 2017-05-20 | Discharge: 2017-05-20 | Disposition: A | Discharge status: Home / Self Care | Payer: Medicaid Other | Source: Ambulatory Visit | Attending: Nurse Practitioner | Admitting: Nurse Practitioner

## 2017-05-20 ENCOUNTER — Other Ambulatory Visit (HOSPITAL_COMMUNITY): Payer: Self-pay | Admitting: *Deleted

## 2017-05-20 ENCOUNTER — Other Ambulatory Visit (HOSPITAL_COMMUNITY): Payer: Self-pay | Admitting: Maternal and Fetal Medicine

## 2017-05-20 DIAGNOSIS — Z3A21 21 weeks gestation of pregnancy: Secondary | ICD-10-CM | POA: Diagnosis not present

## 2017-05-20 DIAGNOSIS — O34219 Maternal care for unspecified type scar from previous cesarean delivery: Secondary | ICD-10-CM

## 2017-05-20 DIAGNOSIS — O3432 Maternal care for cervical incompetence, second trimester: Secondary | ICD-10-CM | POA: Insufficient documentation

## 2017-05-20 DIAGNOSIS — O09522 Supervision of elderly multigravida, second trimester: Secondary | ICD-10-CM

## 2017-05-21 ENCOUNTER — Telehealth: Payer: Self-pay | Admitting: *Deleted

## 2017-05-21 NOTE — Telephone Encounter (Signed)
Pt left message stating that she needs Rx for prenatal vitamins. Per chart review, pt has refills already sent to her pharmacy. I called pt and informed her that she needs to call her pharmacy to request the refill - it is available for her. She voiced understanding.

## 2017-05-26 ENCOUNTER — Encounter: Payer: Medicaid Other | Admitting: Obstetrics & Gynecology

## 2017-06-03 ENCOUNTER — Ambulatory Visit (HOSPITAL_COMMUNITY)
Admission: RE | Admit: 2017-06-03 | Discharge: 2017-06-03 | Disposition: A | Discharge status: Home / Self Care | Payer: Medicaid Other | Source: Ambulatory Visit | Attending: Obstetrics & Gynecology | Admitting: Obstetrics & Gynecology

## 2017-06-03 ENCOUNTER — Encounter: Payer: Medicaid Other | Admitting: Obstetrics & Gynecology

## 2017-06-08 ENCOUNTER — Encounter: Payer: Self-pay | Admitting: *Deleted

## 2017-06-09 ENCOUNTER — Other Ambulatory Visit (HOSPITAL_COMMUNITY): Payer: Self-pay | Admitting: *Deleted

## 2017-06-09 ENCOUNTER — Ambulatory Visit (HOSPITAL_COMMUNITY)
Admission: RE | Admit: 2017-06-09 | Discharge: 2017-06-09 | Disposition: A | Discharge status: Home / Self Care | Payer: Medicaid Other | Source: Ambulatory Visit | Attending: Obstetrics & Gynecology | Admitting: Obstetrics & Gynecology

## 2017-06-09 ENCOUNTER — Other Ambulatory Visit (HOSPITAL_COMMUNITY): Payer: Self-pay | Admitting: Obstetrics and Gynecology

## 2017-06-09 ENCOUNTER — Encounter (HOSPITAL_COMMUNITY): Payer: Self-pay

## 2017-06-09 DIAGNOSIS — O09522 Supervision of elderly multigravida, second trimester: Secondary | ICD-10-CM

## 2017-06-09 DIAGNOSIS — O34219 Maternal care for unspecified type scar from previous cesarean delivery: Secondary | ICD-10-CM

## 2017-06-09 DIAGNOSIS — Z3A24 24 weeks gestation of pregnancy: Secondary | ICD-10-CM

## 2017-06-09 DIAGNOSIS — O3432 Maternal care for cervical incompetence, second trimester: Secondary | ICD-10-CM | POA: Insufficient documentation

## 2017-06-09 DIAGNOSIS — Z3686 Encounter for antenatal screening for cervical length: Secondary | ICD-10-CM

## 2017-06-10 ENCOUNTER — Encounter: Payer: Self-pay | Admitting: Obstetrics and Gynecology

## 2017-06-10 ENCOUNTER — Ambulatory Visit (INDEPENDENT_AMBULATORY_CARE_PROVIDER_SITE_OTHER): Payer: Medicaid Other | Admitting: Obstetrics and Gynecology

## 2017-06-10 VITALS — BP 116/58 | HR 87 | Wt 161.0 lb

## 2017-06-10 DIAGNOSIS — O3430 Maternal care for cervical incompetence, unspecified trimester: Secondary | ICD-10-CM

## 2017-06-10 DIAGNOSIS — O09529 Supervision of elderly multigravida, unspecified trimester: Secondary | ICD-10-CM

## 2017-06-10 DIAGNOSIS — Z98891 History of uterine scar from previous surgery: Secondary | ICD-10-CM

## 2017-06-10 DIAGNOSIS — O0990 Supervision of high risk pregnancy, unspecified, unspecified trimester: Secondary | ICD-10-CM

## 2017-06-10 NOTE — Progress Notes (Signed)
Prenatal Visit Note Date: 06/10/2017 [facility]  Subjective:  Brandi Wong is a 37 y.o. G2P1001 at 2977w6d being seen today for ongoing prenatal care.  She is currently monitored for the following issues for this high-risk pregnancy and has Supervision of high risk pregnancy, antepartum; Advanced maternal age in multigravida, unspecified trimester; Dental caries; Previous cesarean section; Cervical insufficiency during pregnancy, antepartum; Headache in pregnancy; and Nausea/vomiting in pregnancy on their problem list.  Patient reports no complaints.   Contractions: Not present. Vag. Bleeding: None.  Movement: Absent. Denies leaking of fluid.   The following portions of the patient's history were reviewed and updated as appropriate: allergies, current medications, past family history, past medical history, past social history, past surgical history and problem list. Problem list updated.  Objective:   Vitals:   06/10/17 0831  BP: (!) 116/58  Pulse: 87  Weight: 161 lb (73 kg)    Fetal Status: Fetal Heart Rate (bpm): 152 Fundal Height: 24 cm Movement: Absent     General:  Alert, oriented and cooperative. Patient is in no acute distress.  Skin: Skin is warm and dry. No rash noted.   Cardiovascular: Normal heart rate noted  Respiratory: Normal respiratory effort, no problems with respiration noted  Abdomen: Soft, gravid, appropriate for gestational age. Pain/Pressure: Absent     Pelvic:  Cervical exam deferred        Extremities: Normal range of motion.  Edema: None  Mental Status: Normal mood and affect. Normal behavior. Normal judgment and thought content.   Urinalysis:      Assessment and Plan:  Pregnancy: G2P1001 at 1777w6d  1. Supervision of high risk pregnancy, antepartum Routine care. 28wk labs and ask about btl nv.   2. Advanced maternal age in multigravida, unspecified trimester No issues. mfm recommends q6wk growth u/s, which is scheduled  3.  Previous cesarean section Op note in care everywhere. D/w pt at next few visits re: delivery mode  4. Cervical insufficiency during pregnancy, antepartum CL yesterday stable. Has one more  Preterm labor symptoms and general obstetric precautions including but not limited to vaginal bleeding, contractions, leaking of fluid and fetal movement were reviewed in detail with the patient. Please refer to After Visit Summary for other counseling recommendations.  Return in about 2 weeks (around 06/24/2017) for rob and 2hr GTT. low risk ob.   Casar BingPickens, Jeane Cashatt, MD

## 2017-06-24 ENCOUNTER — Encounter: Payer: Medicaid Other | Admitting: Obstetrics & Gynecology

## 2017-06-24 ENCOUNTER — Ambulatory Visit (HOSPITAL_COMMUNITY)
Admission: RE | Admit: 2017-06-24 | Discharge: 2017-06-24 | Disposition: A | Discharge status: Home / Self Care | Payer: Medicaid Other | Source: Ambulatory Visit | Attending: Obstetrics & Gynecology | Admitting: Obstetrics & Gynecology

## 2017-07-01 ENCOUNTER — Ambulatory Visit (HOSPITAL_COMMUNITY)
Admission: RE | Admit: 2017-07-01 | Discharge: 2017-07-01 | Disposition: A | Discharge status: Home / Self Care | Payer: Medicaid Other | Source: Ambulatory Visit | Attending: Obstetrics & Gynecology | Admitting: Obstetrics & Gynecology

## 2017-07-05 ENCOUNTER — Other Ambulatory Visit: Payer: Self-pay | Admitting: *Deleted

## 2017-07-05 DIAGNOSIS — O09529 Supervision of elderly multigravida, unspecified trimester: Secondary | ICD-10-CM

## 2017-07-05 DIAGNOSIS — O0990 Supervision of high risk pregnancy, unspecified, unspecified trimester: Secondary | ICD-10-CM

## 2017-07-06 ENCOUNTER — Encounter: Payer: Self-pay | Admitting: Obstetrics and Gynecology

## 2017-07-06 ENCOUNTER — Other Ambulatory Visit: Payer: Medicaid Other

## 2017-07-06 ENCOUNTER — Ambulatory Visit (INDEPENDENT_AMBULATORY_CARE_PROVIDER_SITE_OTHER): Payer: Medicaid Other | Admitting: Obstetrics and Gynecology

## 2017-07-06 ENCOUNTER — Other Ambulatory Visit (HOSPITAL_COMMUNITY): Payer: Self-pay | Admitting: *Deleted

## 2017-07-06 ENCOUNTER — Other Ambulatory Visit (HOSPITAL_COMMUNITY): Payer: Self-pay | Admitting: Obstetrics and Gynecology

## 2017-07-06 ENCOUNTER — Encounter (HOSPITAL_COMMUNITY): Payer: Self-pay

## 2017-07-06 ENCOUNTER — Ambulatory Visit (HOSPITAL_COMMUNITY)
Admission: RE | Admit: 2017-07-06 | Discharge: 2017-07-06 | Disposition: A | Discharge status: Home / Self Care | Payer: Medicaid Other | Source: Ambulatory Visit | Attending: Obstetrics & Gynecology | Admitting: Obstetrics & Gynecology

## 2017-07-06 VITALS — BP 130/80 | HR 104 | Wt 164.7 lb

## 2017-07-06 DIAGNOSIS — O09523 Supervision of elderly multigravida, third trimester: Secondary | ICD-10-CM | POA: Insufficient documentation

## 2017-07-06 DIAGNOSIS — O09529 Supervision of elderly multigravida, unspecified trimester: Secondary | ICD-10-CM

## 2017-07-06 DIAGNOSIS — O3433 Maternal care for cervical incompetence, third trimester: Secondary | ICD-10-CM | POA: Diagnosis not present

## 2017-07-06 DIAGNOSIS — Z3A28 28 weeks gestation of pregnancy: Secondary | ICD-10-CM

## 2017-07-06 DIAGNOSIS — O0990 Supervision of high risk pregnancy, unspecified, unspecified trimester: Secondary | ICD-10-CM

## 2017-07-06 DIAGNOSIS — O3430 Maternal care for cervical incompetence, unspecified trimester: Secondary | ICD-10-CM

## 2017-07-06 DIAGNOSIS — O09522 Supervision of elderly multigravida, second trimester: Secondary | ICD-10-CM

## 2017-07-06 DIAGNOSIS — Z98891 History of uterine scar from previous surgery: Secondary | ICD-10-CM

## 2017-07-06 DIAGNOSIS — O3432 Maternal care for cervical incompetence, second trimester: Secondary | ICD-10-CM

## 2017-07-06 NOTE — Progress Notes (Signed)
Subjective:  Brandi Wong is a 37 y.o. G2P1001 at 693w4d being seen today for ongoing prenatal care.  She is currently monitored for the following issues for this high-risk pregnancy and has Supervision of high risk pregnancy, antepartum; Advanced maternal age in multigravida, unspecified trimester; Dental caries; Previous cesarean section; Cervical insufficiency during pregnancy, antepartum; Headache in pregnancy; and Nausea/vomiting in pregnancy on their problem list.  Patient reports no complaints.  Contractions: Not present. Vag. Bleeding: None.  Movement: Present. Denies leaking of fluid.   The following portions of the patient's history were reviewed and updated as appropriate: allergies, current medications, past family history, past medical history, past social history, past surgical history and problem list. Problem list updated.  Objective:   Vitals:   07/06/17 0848  BP: 130/80  Pulse: (!) 104  Weight: 74.7 kg (164 lb 11.2 oz)    Fetal Status: Fetal Heart Rate (bpm): 156   Movement: Present     General:  Alert, oriented and cooperative. Patient is in no acute distress.  Skin: Skin is warm and dry. No rash noted.   Cardiovascular: Normal heart rate noted  Respiratory: Normal respiratory effort, no problems with respiration noted  Abdomen: Soft, gravid, appropriate for gestational age. Pain/Pressure: Absent     Pelvic:  Cervical exam deferred        Extremities: Normal range of motion.  Edema: None  Mental Status: Normal mood and affect. Normal behavior. Normal judgment and thought content.   Urinalysis:      Assessment and Plan:  Pregnancy: G2P1001 at [redacted]w[redacted]d  1. Advanced maternal age in multigravida, unspecified trimester Low risk Panorama  2. Cervical insufficiency during pregnancy, antepartum Cerclage in place U/S today  3. Previous cesarean section Need to discuss delivery route  4. Supervision of high risk pregnancy, antepartum Stable Pt has problems arranging  child care as husband is out of town with work. Unable d/t gluocla. Will do 1 hr gluocla with HgbA1C  Preterm labor symptoms and general obstetric precautions including but not limited to vaginal bleeding, contractions, leaking of fluid and fetal movement were reviewed in detail with the patient. Please refer to After Visit Summary for other counseling recommendations.  Return in about 2 weeks (around 07/20/2017) for OB visit.   Hermina StaggersErvin, Sharyl Panchal L, MD

## 2017-07-06 NOTE — Patient Instructions (Signed)
Third Trimester of Pregnancy The third trimester is from week 28 through week 40 (months 7 through 9). The third trimester is a time when the unborn baby (fetus) is growing rapidly. At the end of the ninth month, the fetus is about 20 inches in length and weighs 6-10 pounds. Body changes during your third trimester Your body will continue to go through many changes during pregnancy. The changes vary from woman to woman. During the third trimester:  Your weight will continue to increase. You can expect to gain 25-35 pounds (11-16 kg) by the end of the pregnancy.  You may begin to get stretch marks on your hips, abdomen, and breasts.  You may urinate more often because the fetus is moving lower into your pelvis and pressing on your bladder.  You may develop or continue to have heartburn. This is caused by increased hormones that slow down muscles in the digestive tract.  You may develop or continue to have constipation because increased hormones slow digestion and cause the muscles that push waste through your intestines to relax.  You may develop hemorrhoids. These are swollen veins (varicose veins) in the rectum that can itch or be painful.  You may develop swollen, bulging veins (varicose veins) in your legs.  You may have increased body aches in the pelvis, back, or thighs. This is due to weight gain and increased hormones that are relaxing your joints.  You may have changes in your hair. These can include thickening of your hair, rapid growth, and changes in texture. Some women also have hair loss during or after pregnancy, or hair that feels dry or thin. Your hair will most likely return to normal after your baby is born.  Your breasts will continue to grow and they will continue to become tender. A yellow fluid (colostrum) may leak from your breasts. This is the first milk you are producing for your baby.  Your belly button may stick out.  You may notice more swelling in your hands,  face, or ankles.  You may have increased tingling or numbness in your hands, arms, and legs. The skin on your belly may also feel numb.  You may feel short of breath because of your expanding uterus.  You may have more problems sleeping. This can be caused by the size of your belly, increased need to urinate, and an increase in your body's metabolism.  You may notice the fetus "dropping," or moving lower in your abdomen (lightening).  You may have increased vaginal discharge.  You may notice your joints feel loose and you may have pain around your pelvic bone.  What to expect at prenatal visits You will have prenatal exams every 2 weeks until week 36. Then you will have weekly prenatal exams. During a routine prenatal visit:  You will be weighed to make sure you and the baby are growing normally.  Your blood pressure will be taken.  Your abdomen will be measured to track your baby's growth.  The fetal heartbeat will be listened to.  Any test results from the previous visit will be discussed.  You may have a cervical check near your due date to see if your cervix has softened or thinned (effaced).  You will be tested for Group B streptococcus. This happens between 35 and 37 weeks.  Your health care provider may ask you:  What your birth plan is.  How you are feeling.  If you are feeling the baby move.  If you have had   any abnormal symptoms, such as leaking fluid, bleeding, severe headaches, or abdominal cramping.  If you are using any tobacco products, including cigarettes, chewing tobacco, and electronic cigarettes.  If you have any questions.  Other tests or screenings that may be performed during your third trimester include:  Blood tests that check for low iron levels (anemia).  Fetal testing to check the health, activity level, and growth of the fetus. Testing is done if you have certain medical conditions or if there are problems during the  pregnancy.  Nonstress test (NST). This test checks the health of your baby to make sure there are no signs of problems, such as the baby not getting enough oxygen. During this test, a belt is placed around your belly. The baby is made to move, and its heart rate is monitored during movement.  What is false labor? False labor is a condition in which you feel small, irregular tightenings of the muscles in the womb (contractions) that usually go away with rest, changing position, or drinking water. These are called Braxton Hicks contractions. Contractions may last for hours, days, or even weeks before true labor sets in. If contractions come at regular intervals, become more frequent, increase in intensity, or become painful, you should see your health care provider. What are the signs of labor?  Abdominal cramps.  Regular contractions that start at 10 minutes apart and become stronger and more frequent with time.  Contractions that start on the top of the uterus and spread down to the lower abdomen and back.  Increased pelvic pressure and dull back pain.  A watery or bloody mucus discharge that comes from the vagina.  Leaking of amniotic fluid. This is also known as your "water breaking." It could be a slow trickle or a gush. Let your health care provider know if it has a color or strange odor. If you have any of these signs, call your health care provider right away, even if it is before your due date. Follow these instructions at home: Medicines  Follow your health care provider's instructions regarding medicine use. Specific medicines may be either safe or unsafe to take during pregnancy.  Take a prenatal vitamin that contains at least 600 micrograms (mcg) of folic acid.  If you develop constipation, try taking a stool softener if your health care provider approves. Eating and drinking  Eat a balanced diet that includes fresh fruits and vegetables, whole grains, good sources of protein  such as meat, eggs, or tofu, and low-fat dairy. Your health care provider will help you determine the amount of weight gain that is right for you.  Avoid raw meat and uncooked cheese. These carry germs that can cause birth defects in the baby.  If you have low calcium intake from food, talk to your health care provider about whether you should take a daily calcium supplement.  Eat four or five small meals rather than three large meals a day.  Limit foods that are high in fat and processed sugars, such as fried and sweet foods.  To prevent constipation: ? Drink enough fluid to keep your urine clear or pale yellow. ? Eat foods that are high in fiber, such as fresh fruits and vegetables, whole grains, and beans. Activity  Exercise only as directed by your health care provider. Most women can continue their usual exercise routine during pregnancy. Try to exercise for 30 minutes at least 5 days a week. Stop exercising if you experience uterine contractions.  Avoid heavy   lifting.  Do not exercise in extreme heat or humidity, or at high altitudes.  Wear low-heel, comfortable shoes.  Practice good posture.  You may continue to have sex unless your health care provider tells you otherwise. Relieving pain and discomfort  Take frequent breaks and rest with your legs elevated if you have leg cramps or low back pain.  Take warm sitz baths to soothe any pain or discomfort caused by hemorrhoids. Use hemorrhoid cream if your health care provider approves.  Wear a good support bra to prevent discomfort from breast tenderness.  If you develop varicose veins: ? Wear support pantyhose or compression stockings as told by your healthcare provider. ? Elevate your feet for 15 minutes, 3-4 times a day. Prenatal care  Write down your questions. Take them to your prenatal visits.  Keep all your prenatal visits as told by your health care provider. This is important. Safety  Wear your seat belt at  all times when driving.  Make a list of emergency phone numbers, including numbers for family, friends, the hospital, and police and fire departments. General instructions  Avoid cat litter boxes and soil used by cats. These carry germs that can cause birth defects in the baby. If you have a cat, ask someone to clean the litter box for you.  Do not travel far distances unless it is absolutely necessary and only with the approval of your health care provider.  Do not use hot tubs, steam rooms, or saunas.  Do not drink alcohol.  Do not use any products that contain nicotine or tobacco, such as cigarettes and e-cigarettes. If you need help quitting, ask your health care provider.  Do not use any medicinal herbs or unprescribed drugs. These chemicals affect the formation and growth of the baby.  Do not douche or use tampons or scented sanitary pads.  Do not cross your legs for long periods of time.  To prepare for the arrival of your baby: ? Take prenatal classes to understand, practice, and ask questions about labor and delivery. ? Make a trial run to the hospital. ? Visit the hospital and tour the maternity area. ? Arrange for maternity or paternity leave through employers. ? Arrange for family and friends to take care of pets while you are in the hospital. ? Purchase a rear-facing car seat and make sure you know how to install it in your car. ? Pack your hospital bag. ? Prepare the baby's nursery. Make sure to remove all pillows and stuffed animals from the baby's crib to prevent suffocation.  Visit your dentist if you have not gone during your pregnancy. Use a soft toothbrush to brush your teeth and be gentle when you floss. Contact a health care provider if:  You are unsure if you are in labor or if your water has broken.  You become dizzy.  You have mild pelvic cramps, pelvic pressure, or nagging pain in your abdominal area.  You have lower back pain.  You have persistent  nausea, vomiting, or diarrhea.  You have an unusual or bad smelling vaginal discharge.  You have pain when you urinate. Get help right away if:  Your water breaks before 37 weeks.  You have regular contractions less than 5 minutes apart before 37 weeks.  You have a fever.  You are leaking fluid from your vagina.  You have spotting or bleeding from your vagina.  You have severe abdominal pain or cramping.  You have rapid weight loss or weight gain.    You have shortness of breath with chest pain.  You notice sudden or extreme swelling of your face, hands, ankles, feet, or legs.  Your baby makes fewer than 10 movements in 2 hours.  You have severe headaches that do not go away when you take medicine.  You have vision changes. Summary  The third trimester is from week 28 through week 40, months 7 through 9. The third trimester is a time when the unborn baby (fetus) is growing rapidly.  During the third trimester, your discomfort may increase as you and your baby continue to gain weight. You may have abdominal, leg, and back pain, sleeping problems, and an increased need to urinate.  During the third trimester your breasts will keep growing and they will continue to become tender. A yellow fluid (colostrum) may leak from your breasts. This is the first milk you are producing for your baby.  False labor is a condition in which you feel small, irregular tightenings of the muscles in the womb (contractions) that eventually go away. These are called Braxton Hicks contractions. Contractions may last for hours, days, or even weeks before true labor sets in.  Signs of labor can include: abdominal cramps; regular contractions that start at 10 minutes apart and become stronger and more frequent with time; watery or bloody mucus discharge that comes from the vagina; increased pelvic pressure and dull back pain; and leaking of amniotic fluid. This information is not intended to replace advice  given to you by your health care provider. Make sure you discuss any questions you have with your health care provider. Document Released: 04/14/2001 Document Revised: 09/26/2015 Document Reviewed: 06/21/2012 Elsevier Interactive Patient Education  2017 [employer]  

## 2017-07-15 ENCOUNTER — Other Ambulatory Visit: Payer: Medicaid Other

## 2017-07-22 ENCOUNTER — Encounter: Payer: Medicaid Other | Admitting: Medical

## 2017-08-02 ENCOUNTER — Ambulatory Visit (INDEPENDENT_AMBULATORY_CARE_PROVIDER_SITE_OTHER): Payer: Medicaid Other | Admitting: Family Medicine

## 2017-08-02 ENCOUNTER — Encounter (HOSPITAL_COMMUNITY): Payer: Self-pay

## 2017-08-02 VITALS — BP 113/70 | HR 90 | Wt 169.7 lb

## 2017-08-02 DIAGNOSIS — O0990 Supervision of high risk pregnancy, unspecified, unspecified trimester: Secondary | ICD-10-CM

## 2017-08-02 DIAGNOSIS — O3433 Maternal care for cervical incompetence, third trimester: Secondary | ICD-10-CM

## 2017-08-02 DIAGNOSIS — O0993 Supervision of high risk pregnancy, unspecified, third trimester: Secondary | ICD-10-CM

## 2017-08-02 DIAGNOSIS — Z23 Encounter for immunization: Secondary | ICD-10-CM

## 2017-08-02 DIAGNOSIS — Z98891 History of uterine scar from previous surgery: Secondary | ICD-10-CM

## 2017-08-02 DIAGNOSIS — O3430 Maternal care for cervical incompetence, unspecified trimester: Secondary | ICD-10-CM

## 2017-08-02 NOTE — Progress Notes (Signed)
Pt states did not complete Glucose test on 07/06/17, due to child situation..has fasted this am.Tdap given today

## 2017-08-02 NOTE — Progress Notes (Signed)
   PRENATAL VISIT NOTE  Subjective:  Brandi Wong is a 37 y.o. G2P1001 at 3676w3d being seen today for ongoing prenatal care.  She is currently monitored for the following issues for this high-risk pregnancy and has Supervision of high risk pregnancy, antepartum; Supervision of elderly multigravida in third trimester; Dental caries; Previous cesarean section; Cervical cerclage suture present, third trimester; Headache in pregnancy; Nausea/vomiting in pregnancy; and [redacted] weeks gestation of pregnancy on their problem list.  Patient reports no complaints.  Contractions: Not present. Vag. Bleeding: None.  Movement: Present. Denies leaking of fluid.   The following portions of the patient's history were reviewed and updated as appropriate: allergies, current medications, past family history, past medical history, past social history, past surgical history and problem list. Problem list updated.  Objective:   Vitals:   08/02/17 0829  BP: 113/70  Pulse: 90  Weight: 169 lb 11.2 oz (77 kg)    Fetal Status: Fetal Heart Rate (bpm): 155 Fundal Height: 31 cm Movement: Present     General:  Alert, oriented and cooperative. Patient is in no acute distress.  Skin: Skin is warm and dry. No rash noted.   Cardiovascular: Normal heart rate noted  Respiratory: Normal respiratory effort, no problems with respiration noted  Abdomen: Soft, gravid, appropriate for gestational age.  Pain/Pressure: Absent     Pelvic: Cervical exam deferred        Extremities: Normal range of motion.  Edema: Trace  Mental Status: Normal mood and affect. Normal behavior. Normal judgment and thought content.   Assessment and Plan:  Pregnancy: G2P1001 at 4276w3d  1. Supervision of high risk pregnancy, antepartum FHT and FH normal - Tdap vaccine greater than or equal to 7yo IM - Glucose Tolerance, 2 Hours w/1 Hour - CBC - HIV antibody - RPR  2. Cervical insufficiency during pregnancy, antepartum Cerclage in place. No  issues  3. Previous cesarean section Desires repeat. No BTL  4. Cervical cerclage suture present, third trimester  Preterm labor symptoms and general obstetric precautions including but not limited to vaginal bleeding, contractions, leaking of fluid and fetal movement were reviewed in detail with the patient. Please refer to After Visit Summary for other counseling recommendations.  No follow-ups on file.  Future Appointments  Date Time Provider Department Center  08/16/2017 10:45 AM [facility] US 2 [facility]    Levie HeritageJacob J Montrelle Eddings, DO

## 2017-08-03 LAB — GLUCOSE TOLERANCE, 2 HOURS W/ 1HR
Glucose, 1 hour: 166 mg/dL (ref 65–179)
Glucose, 2 hour: 73 mg/dL (ref 65–152)
Glucose, Fasting: 82 mg/dL (ref 65–91)

## 2017-08-03 LAB — CBC
HEMATOCRIT: 34.6 % (ref 34.0–46.6)
Hemoglobin: 12 g/dL (ref 11.1–15.9)
MCH: 32.8 pg (ref 26.6–33.0)
MCHC: 34.7 g/dL (ref 31.5–35.7)
MCV: 95 fL (ref 79–97)
Platelets: 210 10*3/uL (ref 150–379)
RBC: 3.66 x10E6/uL — ABNORMAL LOW (ref 3.77–5.28)
RDW: 13.6 % (ref 12.3–15.4)
WBC: 12 10*3/uL — AB (ref 3.4–10.8)

## 2017-08-03 LAB — RPR: RPR: NONREACTIVE

## 2017-08-03 LAB — HIV ANTIBODY (ROUTINE TESTING W REFLEX): HIV Screen 4th Generation wRfx: NONREACTIVE

## 2017-08-05 ENCOUNTER — Encounter: Payer: Self-pay | Admitting: Family Medicine

## 2017-08-16 ENCOUNTER — Ambulatory Visit (HOSPITAL_COMMUNITY)
Admission: RE | Admit: 2017-08-16 | Discharge: 2017-08-16 | Disposition: A | Discharge status: Home / Self Care | Payer: Medicaid Other | Source: Ambulatory Visit | Attending: Obstetrics and Gynecology | Admitting: Obstetrics and Gynecology

## 2017-08-16 ENCOUNTER — Encounter: Payer: Self-pay | Admitting: Family Medicine

## 2017-08-16 ENCOUNTER — Encounter (HOSPITAL_COMMUNITY): Payer: Self-pay

## 2017-08-16 DIAGNOSIS — O09523 Supervision of elderly multigravida, third trimester: Secondary | ICD-10-CM | POA: Insufficient documentation

## 2017-08-16 DIAGNOSIS — O219 Vomiting of pregnancy, unspecified: Secondary | ICD-10-CM

## 2017-08-16 DIAGNOSIS — O3433 Maternal care for cervical incompetence, third trimester: Secondary | ICD-10-CM

## 2017-08-16 DIAGNOSIS — O09529 Supervision of elderly multigravida, unspecified trimester: Secondary | ICD-10-CM | POA: Diagnosis present

## 2017-08-16 DIAGNOSIS — R51 Headache: Secondary | ICD-10-CM

## 2017-08-16 DIAGNOSIS — Z3A34 34 weeks gestation of pregnancy: Secondary | ICD-10-CM | POA: Diagnosis not present

## 2017-08-16 DIAGNOSIS — O26899 Other specified pregnancy related conditions, unspecified trimester: Secondary | ICD-10-CM

## 2017-08-17 ENCOUNTER — Ambulatory Visit (INDEPENDENT_AMBULATORY_CARE_PROVIDER_SITE_OTHER): Payer: Medicaid Other | Admitting: Obstetrics and Gynecology

## 2017-08-17 ENCOUNTER — Encounter: Payer: Self-pay | Admitting: Obstetrics and Gynecology

## 2017-08-17 ENCOUNTER — Ambulatory Visit (HOSPITAL_COMMUNITY): Payer: Medicaid Other

## 2017-08-17 VITALS — BP 118/65 | HR 85 | Wt 173.5 lb

## 2017-08-17 DIAGNOSIS — Z98891 History of uterine scar from previous surgery: Secondary | ICD-10-CM

## 2017-08-17 DIAGNOSIS — O3433 Maternal care for cervical incompetence, third trimester: Secondary | ICD-10-CM

## 2017-08-17 DIAGNOSIS — O09523 Supervision of elderly multigravida, third trimester: Secondary | ICD-10-CM

## 2017-08-17 NOTE — Progress Notes (Signed)
Subjective:  Brandi Wong is a 37 y.o. G2P1001 at 8245w4d being seen today for ongoing prenatal care.  She is currently monitored for the following issues for this high-risk pregnancy and has Supervision of high risk pregnancy, antepartum; Supervision of elderly multigravida in third trimester; Dental caries; Previous cesarean section; Cervical cerclage suture present, third trimester; Headache in pregnancy; and Nausea/vomiting in pregnancy on their problem list.  Patient reports no complaints.  Contractions: Not present. Vag. Bleeding: None.  Movement: Present. Denies leaking of fluid.   The following portions of the patient's history were reviewed and updated as appropriate: allergies, current medications, past family history, past medical history, past social history, past surgical history and problem list. Problem list updated.  Objective:   Vitals:   08/17/17 0855  BP: 118/65  Pulse: 85  Weight: 173 lb 8 oz (78.7 kg)    Fetal Status: Fetal Heart Rate (bpm): 150   Movement: Present     General:  Alert, oriented and cooperative. Patient is in no acute distress.  Skin: Skin is warm and dry. No rash noted.   Cardiovascular: Normal heart rate noted  Respiratory: Normal respiratory effort, no problems with respiration noted  Abdomen: Soft, gravid, appropriate for gestational age. Pain/Pressure: Present     Pelvic:  Cervical exam deferred        Extremities: Normal range of motion.  Edema: None  Mental Status: Normal mood and affect. Normal behavior. Normal judgment and thought content.   Urinalysis:      Assessment and Plan:  Pregnancy: G2P1001 at 4245w4d  1. Supervision of elderly multigravida in third trimester Stable Start weekly antenatal testing with next OB visit Appropriate growth 08/16/17   2. Previous cesarean section Desires repeat Scheduled at 39 weeks  3. Cervical cerclage suture present, third trimester Pt desires to have cerclage removed at time of c  section  Preterm labor symptoms and general obstetric precautions including but not limited to vaginal bleeding, contractions, leaking of fluid and fetal movement were reviewed in detail with the patient. Please refer to After Visit Summary for other counseling recommendations.  Return in about 2 weeks (around 08/31/2017) for OB visit.   Brandi Wong, Brandi Placide L, MD

## 2017-08-17 NOTE — Patient Instructions (Signed)
Third Trimester of Pregnancy The third trimester is from week 28 through week 40 (months 7 through 9). The third trimester is a time when the unborn baby (fetus) is growing rapidly. At the end of the ninth month, the fetus is about 20 inches in length and weighs 6-10 pounds. Body changes during your third trimester Your body will continue to go through many changes during pregnancy. The changes vary from woman to woman. During the third trimester:  Your weight will continue to increase. You can expect to gain 25-35 pounds (11-16 kg) by the end of the pregnancy.  You may begin to get stretch marks on your hips, abdomen, and breasts.  You may urinate more often because the fetus is moving lower into your pelvis and pressing on your bladder.  You may develop or continue to have heartburn. This is caused by increased hormones that slow down muscles in the digestive tract.  You may develop or continue to have constipation because increased hormones slow digestion and cause the muscles that push waste through your intestines to relax.  You may develop hemorrhoids. These are swollen veins (varicose veins) in the rectum that can itch or be painful.  You may develop swollen, bulging veins (varicose veins) in your legs.  You may have increased body aches in the pelvis, back, or thighs. This is due to weight gain and increased hormones that are relaxing your joints.  You may have changes in your hair. These can include thickening of your hair, rapid growth, and changes in texture. Some women also have hair loss during or after pregnancy, or hair that feels dry or thin. Your hair will most likely return to normal after your baby is born.  Your breasts will continue to grow and they will continue to become tender. A yellow fluid (colostrum) may leak from your breasts. This is the first milk you are producing for your baby.  Your belly button may stick out.  You may notice more swelling in your hands,  face, or ankles.  You may have increased tingling or numbness in your hands, arms, and legs. The skin on your belly may also feel numb.  You may feel short of breath because of your expanding uterus.  You may have more problems sleeping. This can be caused by the size of your belly, increased need to urinate, and an increase in your body's metabolism.  You may notice the fetus "dropping," or moving lower in your abdomen (lightening).  You may have increased vaginal discharge.  You may notice your joints feel loose and you may have pain around your pelvic bone.  What to expect at prenatal visits You will have prenatal exams every 2 weeks until week 36. Then you will have weekly prenatal exams. During a routine prenatal visit:  You will be weighed to make sure you and the baby are growing normally.  Your blood pressure will be taken.  Your abdomen will be measured to track your baby's growth.  The fetal heartbeat will be listened to.  Any test results from the previous visit will be discussed.  You may have a cervical check near your due date to see if your cervix has softened or thinned (effaced).  You will be tested for Group B streptococcus. This happens between 35 and 37 weeks.  Your health care provider may ask you:  What your birth plan is.  How you are feeling.  If you are feeling the baby move.  If you have had   any abnormal symptoms, such as leaking fluid, bleeding, severe headaches, or abdominal cramping.  If you are using any tobacco products, including cigarettes, chewing tobacco, and electronic cigarettes.  If you have any questions.  Other tests or screenings that may be performed during your third trimester include:  Blood tests that check for low iron levels (anemia).  Fetal testing to check the health, activity level, and growth of the fetus. Testing is done if you have certain medical conditions or if there are problems during the  pregnancy.  Nonstress test (NST). This test checks the health of your baby to make sure there are no signs of problems, such as the baby not getting enough oxygen. During this test, a belt is placed around your belly. The baby is made to move, and its heart rate is monitored during movement.  What is false labor? False labor is a condition in which you feel small, irregular tightenings of the muscles in the womb (contractions) that usually go away with rest, changing position, or drinking water. These are called Braxton Hicks contractions. Contractions may last for hours, days, or even weeks before true labor sets in. If contractions come at regular intervals, become more frequent, increase in intensity, or become painful, you should see your health care provider. What are the signs of labor?  Abdominal cramps.  Regular contractions that start at 10 minutes apart and become stronger and more frequent with time.  Contractions that start on the top of the uterus and spread down to the lower abdomen and back.  Increased pelvic pressure and dull back pain.  A watery or bloody mucus discharge that comes from the vagina.  Leaking of amniotic fluid. This is also known as your "water breaking." It could be a slow trickle or a gush. Let your health care provider know if it has a color or strange odor. If you have any of these signs, call your health care provider right away, even if it is before your due date. Follow these instructions at home: Medicines  Follow your health care provider's instructions regarding medicine use. Specific medicines may be either safe or unsafe to take during pregnancy.  Take a prenatal vitamin that contains at least 600 micrograms (mcg) of folic acid.  If you develop constipation, try taking a stool softener if your health care provider approves. Eating and drinking  Eat a balanced diet that includes fresh fruits and vegetables, whole grains, good sources of protein  such as meat, eggs, or tofu, and low-fat dairy. Your health care provider will help you determine the amount of weight gain that is right for you.  Avoid raw meat and uncooked cheese. These carry germs that can cause birth defects in the baby.  If you have low calcium intake from food, talk to your health care provider about whether you should take a daily calcium supplement.  Eat four or five small meals rather than three large meals a day.  Limit foods that are high in fat and processed sugars, such as fried and sweet foods.  To prevent constipation: ? Drink enough fluid to keep your urine clear or pale yellow. ? Eat foods that are high in fiber, such as fresh fruits and vegetables, whole grains, and beans. Activity  Exercise only as directed by your health care provider. Most women can continue their usual exercise routine during pregnancy. Try to exercise for 30 minutes at least 5 days a week. Stop exercising if you experience uterine contractions.  Avoid heavy   lifting.  Do not exercise in extreme heat or humidity, or at high altitudes.  Wear low-heel, comfortable shoes.  Practice good posture.  You may continue to have sex unless your health care provider tells you otherwise. Relieving pain and discomfort  Take frequent breaks and rest with your legs elevated if you have leg cramps or low back pain.  Take warm sitz baths to soothe any pain or discomfort caused by hemorrhoids. Use hemorrhoid cream if your health care provider approves.  Wear a good support bra to prevent discomfort from breast tenderness.  If you develop varicose veins: ? Wear support pantyhose or compression stockings as told by your healthcare provider. ? Elevate your feet for 15 minutes, 3-4 times a day. Prenatal care  Write down your questions. Take them to your prenatal visits.  Keep all your prenatal visits as told by your health care provider. This is important. Safety  Wear your seat belt at  all times when driving.  Make a list of emergency phone numbers, including numbers for family, friends, the hospital, and police and fire departments. General instructions  Avoid cat litter boxes and soil used by cats. These carry germs that can cause birth defects in the baby. If you have a cat, ask someone to clean the litter box for you.  Do not travel far distances unless it is absolutely necessary and only with the approval of your health care provider.  Do not use hot tubs, steam rooms, or saunas.  Do not drink alcohol.  Do not use any products that contain nicotine or tobacco, such as cigarettes and e-cigarettes. If you need help quitting, ask your health care provider.  Do not use any medicinal herbs or unprescribed drugs. These chemicals affect the formation and growth of the baby.  Do not douche or use tampons or scented sanitary pads.  Do not cross your legs for long periods of time.  To prepare for the arrival of your baby: ? Take prenatal classes to understand, practice, and ask questions about labor and delivery. ? Make a trial run to the hospital. ? Visit the hospital and tour the maternity area. ? Arrange for maternity or paternity leave through employers. ? Arrange for family and friends to take care of pets while you are in the hospital. ? Purchase a rear-facing car seat and make sure you know how to install it in your car. ? Pack your hospital bag. ? Prepare the baby's nursery. Make sure to remove all pillows and stuffed animals from the baby's crib to prevent suffocation.  Visit your dentist if you have not gone during your pregnancy. Use a soft toothbrush to brush your teeth and be gentle when you floss. Contact a health care provider if:  You are unsure if you are in labor or if your water has broken.  You become dizzy.  You have mild pelvic cramps, pelvic pressure, or nagging pain in your abdominal area.  You have lower back pain.  You have persistent  nausea, vomiting, or diarrhea.  You have an unusual or bad smelling vaginal discharge.  You have pain when you urinate. Get help right away if:  Your water breaks before 37 weeks.  You have regular contractions less than 5 minutes apart before 37 weeks.  You have a fever.  You are leaking fluid from your vagina.  You have spotting or bleeding from your vagina.  You have severe abdominal pain or cramping.  You have rapid weight loss or weight gain.    You have shortness of breath with chest pain.  You notice sudden or extreme swelling of your face, hands, ankles, feet, or legs.  Your baby makes fewer than 10 movements in 2 hours.  You have severe headaches that do not go away when you take medicine.  You have vision changes. Summary  The third trimester is from week 28 through week 40, months 7 through 9. The third trimester is a time when the unborn baby (fetus) is growing rapidly.  During the third trimester, your discomfort may increase as you and your baby continue to gain weight. You may have abdominal, leg, and back pain, sleeping problems, and an increased need to urinate.  During the third trimester your breasts will keep growing and they will continue to become tender. A yellow fluid (colostrum) may leak from your breasts. This is the first milk you are producing for your baby.  False labor is a condition in which you feel small, irregular tightenings of the muscles in the womb (contractions) that eventually go away. These are called Braxton Hicks contractions. Contractions may last for hours, days, or even weeks before true labor sets in.  Signs of labor can include: abdominal cramps; regular contractions that start at 10 minutes apart and become stronger and more frequent with time; watery or bloody mucus discharge that comes from the vagina; increased pelvic pressure and dull back pain; and leaking of amniotic fluid. This information is not intended to replace advice  given to you by your health care provider. Make sure you discuss any questions you have with your health care provider. Document Released: 04/14/2001 Document Revised: 09/26/2015 Document Reviewed: 06/21/2012 Elsevier Interactive Patient Education  2017 [employer]  

## 2017-08-23 ENCOUNTER — Telehealth: Payer: Self-pay | Admitting: *Deleted

## 2017-08-23 NOTE — Telephone Encounter (Signed)
Per chart review with Dr. Macon LargeAnyanwu, pt does not meet criteria for fetal testing per guideline. She has scheduled appt tomorrow (4/23) @ 1115. I called pt and heard message stating that the voicemail is full. I then called pt's husband "Annice NeedyYared" and informed him that pt does not need to come to office as scheduled tomorrow. The appt has been cancelled. Her next appt will be 5/1 @ 1015. He voiced understanding and appreciation for the call. He will tell his wife.

## 2017-08-24 ENCOUNTER — Other Ambulatory Visit: Payer: Medicaid Other

## 2017-09-01 ENCOUNTER — Encounter: Payer: Self-pay | Admitting: *Deleted

## 2017-09-01 ENCOUNTER — Encounter: Payer: Self-pay | Admitting: Obstetrics and Gynecology

## 2017-09-01 ENCOUNTER — Other Ambulatory Visit: Payer: Medicaid Other

## 2017-09-01 ENCOUNTER — Other Ambulatory Visit (HOSPITAL_COMMUNITY)
Admission: RE | Admit: 2017-09-01 | Discharge: 2017-09-01 | Disposition: A | Discharge status: Home / Self Care | Payer: Medicaid Other | Source: Ambulatory Visit | Attending: Obstetrics and Gynecology | Admitting: Obstetrics and Gynecology

## 2017-09-01 ENCOUNTER — Ambulatory Visit (INDEPENDENT_AMBULATORY_CARE_PROVIDER_SITE_OTHER): Payer: Medicaid Other | Admitting: Obstetrics and Gynecology

## 2017-09-01 VITALS — BP 118/65 | HR 90 | Wt 173.3 lb

## 2017-09-01 DIAGNOSIS — O09523 Supervision of elderly multigravida, third trimester: Secondary | ICD-10-CM

## 2017-09-01 DIAGNOSIS — O0990 Supervision of high risk pregnancy, unspecified, unspecified trimester: Secondary | ICD-10-CM

## 2017-09-01 DIAGNOSIS — O3433 Maternal care for cervical incompetence, third trimester: Secondary | ICD-10-CM

## 2017-09-01 DIAGNOSIS — Z3A36 36 weeks gestation of pregnancy: Secondary | ICD-10-CM | POA: Insufficient documentation

## 2017-09-01 DIAGNOSIS — Z98891 History of uterine scar from previous surgery: Secondary | ICD-10-CM

## 2017-09-01 NOTE — Progress Notes (Signed)
   PRENATAL VISIT NOTE  Subjective:  Brandi Wong is a 37 y.o. G2P1001 at [redacted]w[redacted]d being seen today for ongoing prenatal care.  She is currently monitored for the following issues for this high-risk pregnancy and has Supervision of high risk pregnancy, antepartum; Supervision of elderly multigravida in third trimester; Dental caries; Previous cesarean section; Cervical cerclage suture present, third trimester; Headache in pregnancy; and Nausea/vomiting in pregnancy on their problem list.  Patient reports occasional contractions.  Contractions: Not present. Vag. Bleeding: None.  Movement: Present. Denies leaking of fluid.   The following portions of the patient's history were reviewed and updated as appropriate: allergies, current medications, past family history, past medical history, past social history, past surgical history and problem list. Problem list updated.  Objective:   Vitals:   09/01/17 1052  BP: 118/65  Pulse: 90  Weight: 173 lb 4.8 oz (78.6 kg)    Fetal Status: Fetal Heart Rate (bpm): 154   Movement: Present     General:  Alert, oriented and cooperative. Patient is in no acute distress.  Skin: Skin is warm and dry. No rash noted.   Cardiovascular: Normal heart rate noted  Respiratory: Normal respiratory effort, no problems with respiration noted  Abdomen: Soft, gravid, appropriate for gestational age.  Pain/Pressure: Absent     Pelvic: Cervical exam deferred       cervix with cerclage easily visible, cervix friable with clear discharge at os  Extremities: Normal range of motion.  Edema: None  Mental Status: Normal mood and affect. Normal behavior. Normal judgment and thought content.   Assessment and Plan:  Pregnancy: G2P1001 at [redacted]w[redacted]d  1. Supervision of elderly multigravida in third trimester - Culture, beta strep (group b only) - GC/Chlamydia probe amp ([place])not at [facility]  2. Cervical cerclage suture present, third trimester Cerclage removed today, piece  remains within cervix, patient very uncomfortable with exam, reviewed for patient to observe and let provider know if it comes out, will remove at time of c-section under anesthesia - reviewed importance of presenting to hospital with labor, contractions, bleeding  3. Supervision of high risk pregnancy, antepartum  4. Previous cesarean section Scheduled for RCS 09/17/17  Preterm labor symptoms and general obstetric precautions including but not limited to vaginal bleeding, contractions, leaking of fluid and fetal movement were reviewed in detail with the patient. Please refer to After Visit Summary for other counseling recommendations.  Return in about 1 week (around 09/08/2017) for OB visit (MD).  Future Appointments  Date Time Provider Department Center  09/08/2017 10:15 AM Conan Bowens, MD [facility]    Conan Bowens, MD \

## 2017-09-02 ENCOUNTER — Encounter (HOSPITAL_COMMUNITY): Payer: Self-pay

## 2017-09-02 LAB — GC/CHLAMYDIA PROBE AMP (~~LOC~~) NOT AT ARMC
Chlamydia: NEGATIVE
NEISSERIA GONORRHEA: NEGATIVE

## 2017-09-04 LAB — CULTURE, BETA STREP (GROUP B ONLY): STREP GP B CULTURE: NEGATIVE

## 2017-09-08 ENCOUNTER — Encounter: Payer: Self-pay | Admitting: Obstetrics and Gynecology

## 2017-09-08 ENCOUNTER — Other Ambulatory Visit: Payer: Medicaid Other

## 2017-09-08 ENCOUNTER — Ambulatory Visit (INDEPENDENT_AMBULATORY_CARE_PROVIDER_SITE_OTHER): Payer: Medicaid Other | Admitting: Obstetrics and Gynecology

## 2017-09-08 VITALS — BP 108/68 | HR 97 | Wt 172.9 lb

## 2017-09-08 DIAGNOSIS — Z98891 History of uterine scar from previous surgery: Secondary | ICD-10-CM

## 2017-09-08 DIAGNOSIS — O0990 Supervision of high risk pregnancy, unspecified, unspecified trimester: Secondary | ICD-10-CM

## 2017-09-08 DIAGNOSIS — O3433 Maternal care for cervical incompetence, third trimester: Secondary | ICD-10-CM

## 2017-09-08 NOTE — Progress Notes (Signed)
   PRENATAL VISIT NOTE  Subjective:  Brandi Wong is a 37 y.o. G2P1001 at [redacted]w[redacted]d being seen today for ongoing prenatal care.  She is currently monitored for the following issues for this high-risk pregnancy and has Supervision of high risk pregnancy, antepartum; Supervision of elderly multigravida in third trimester; Dental caries; Previous cesarean section; Cervical cerclage suture present, third trimester; Headache in pregnancy; and Nausea/vomiting in pregnancy on their problem list.  Patient reports no complaints.  Contractions: Not present. Vag. Bleeding: None.  Movement: Present. Denies leaking of fluid.   The following portions of the patient's history were reviewed and updated as appropriate: allergies, current medications, past family history, past medical history, past social history, past surgical history and problem list. Problem list updated.  Objective:   Vitals:   09/08/17 1044  BP: 108/68  Pulse: 97  Weight: 172 lb 14.4 oz (78.4 kg)    Fetal Status: Fetal Heart Rate (bpm): 156 Fundal Height: 37 cm Movement: Present     General:  Alert, oriented and cooperative. Patient is in no acute distress.  Skin: Skin is warm and dry. No rash noted.   Cardiovascular: Normal heart rate noted  Respiratory: Normal respiratory effort, no problems with respiration noted  Abdomen: Soft, gravid, appropriate for gestational age.  Pain/Pressure: Absent     Pelvic: Cervical exam deferred        Extremities: Normal range of motion.  Edema: None  Mental Status: Normal mood and affect. Normal behavior. Normal judgment and thought content.   Assessment and Plan:  Pregnancy: G2P1001 at [redacted]w[redacted]d  1. Supervision of high risk pregnancy, antepartum  2. Previous cesarean section RCS scheduled for 09/17/17  3. Cervical cerclage suture present, third trimester S/p removal last week, one piece remains in, need to look for it at time of CS  Term labor symptoms and general obstetric precautions  including but not limited to vaginal bleeding, contractions, leaking of fluid and fetal movement were reviewed in detail with the patient. Please refer to After Visit Summary for other counseling recommendations.  Return in about 1 week (around 09/15/2017) for OB visit (MD).  Future Appointments  Date Time Provider Department Center  09/16/2017  9:45 AM [facility] PAT 5 [facility] None    Conan Bowens, MD

## 2017-09-08 NOTE — Progress Notes (Signed)
108/68

## 2017-09-15 NOTE — Patient Instructions (Signed)
Brandi Wong  09/15/2017   Your procedure is scheduled on:  09/17/2017  Enter through the Main Entrance of [facility] at 0730 AM.  Pick up the phone at the desk and dial [phone]  Call this number if you have problems the morning of [phone]  Remember:   Do not eat food:(After Midnight) Desps de medianoche.  Do not drink clear liquids: (After Midnight) Desps de medianoche.  Take these medicines the morning of surgery with A SIP OF WATER: none   Do not wear jewelry, make-up or nail polish.  Do not wear lotions, powders, or perfumes. Do not wear deodorant.  Do not shave 48 hours prior to surgery.  Do not bring valuables to the hospital.  [facility] is not   responsible for any belongings or valuables brought to the hospital.  Contacts, dentures or bridgework may not be worn into surgery.  Leave suitcase in the car. After surgery it may be brought to your room.  For patients admitted to the hospital, checkout time is 11:00 AM the day of              discharge.    N/A   Please read over the following fact sheets that you were given:   Surgical Site Infection Prevention

## 2017-09-16 ENCOUNTER — Encounter (HOSPITAL_COMMUNITY)
Admission: RE | Admit: 2017-09-16 | Discharge: 2017-09-16 | Disposition: A | Discharge status: Home / Self Care | Payer: Medicaid Other | Source: Ambulatory Visit | Attending: Obstetrics and Gynecology | Admitting: Obstetrics and Gynecology

## 2017-09-16 ENCOUNTER — Ambulatory Visit (INDEPENDENT_AMBULATORY_CARE_PROVIDER_SITE_OTHER): Payer: Medicaid Other | Admitting: Student

## 2017-09-16 VITALS — BP 125/83 | HR 89 | Wt 175.2 lb

## 2017-09-16 DIAGNOSIS — O0990 Supervision of high risk pregnancy, unspecified, unspecified trimester: Secondary | ICD-10-CM

## 2017-09-16 LAB — TYPE AND SCREEN
ABO/RH(D): O POS
ANTIBODY SCREEN: NEGATIVE

## 2017-09-16 LAB — CBC
HCT: 35.3 % — ABNORMAL LOW (ref 36.0–46.0)
HEMOGLOBIN: 12.3 g/dL (ref 12.0–15.0)
MCH: 32.5 pg (ref 26.0–34.0)
MCHC: 34.8 g/dL (ref 30.0–36.0)
MCV: 93.4 fL (ref 78.0–100.0)
Platelets: 219 10*3/uL (ref 150–400)
RBC: 3.78 MIL/uL — AB (ref 3.87–5.11)
RDW: 13.9 % (ref 11.5–15.5)
WBC: 10.9 10*3/uL — AB (ref 4.0–10.5)

## 2017-09-16 NOTE — Progress Notes (Signed)
   PRENATAL VISIT NOTE  Subjective:  Brandi Wong is a 37 y.o. G2P1001 at [redacted]w[redacted]d being seen today for ongoing prenatal care.  She is currently monitored for the following issues for this high-risk pregnancy and has Supervision of high risk pregnancy, antepartum; Supervision of elderly multigravida in third trimester; Dental caries; Previous cesarean section; Cervical cerclage suture present, third trimester; Headache in pregnancy; and Nausea/vomiting in pregnancy on their problem list.  Patient reports no complaints.  Contractions: Not present. Vag. Bleeding: None.  Movement: Present. Denies leaking of fluid.   The following portions of the patient's history were reviewed and updated as appropriate: allergies, current medications, past family history, past medical history, past social history, past surgical history and problem list. Problem list updated.  Objective:   Vitals:   09/16/17 1103  BP: 125/83  Pulse: 89  Weight: 175 lb 3.2 oz (79.5 kg)    Fetal Status: Fetal Heart Rate (bpm): 154 Fundal Height: 39 cm Movement: Present     General:  Alert, oriented and cooperative. Patient is in no acute distress.  Skin: Skin is warm and dry. No rash noted.   Cardiovascular: Normal heart rate noted  Respiratory: Normal respiratory effort, no problems with respiration noted  Abdomen: Soft, gravid, appropriate for gestational age.  Pain/Pressure: Absent     Pelvic: Cervical exam deferred        Extremities: Normal range of motion.  Edema: None  Mental Status: Normal mood and affect. Normal behavior. Normal judgment and thought content.   Assessment and Plan:  Pregnancy: G2P1001 at [redacted]w[redacted]d  1. Supervision of high risk pregnancy, antepartum -doing well -scheduled for RCS tomorrow. Had PAT this morning.   Term labor symptoms and general obstetric precautions including but not limited to vaginal bleeding, contractions, leaking of fluid and fetal movement were reviewed in detail with the  patient. Please refer to After Visit Summary for other counseling recommendations.  Return for schedule PP appt, has c/section tomorrow.  No future appointments.  Judeth Horn, NP

## 2017-09-16 NOTE — Patient Instructions (Signed)
Cesarean Delivery Cesarean birth, or cesarean delivery, is the surgical delivery of a baby through an incision in the abdomen and the uterus. This may be referred to as a C-section. This procedure may be scheduled ahead of time, or it may be done in an emergency situation. Tell a health care provider about:  Any allergies you have.  All medicines you are taking, including vitamins, herbs, eye drops, creams, and over-the-counter medicines.  Any problems you or family members have had with anesthetic medicines.  Any blood disorders you have.  Any surgeries you have had.  Any medical conditions you have.  Whether you or any members of your family have a history of deep vein thrombosis (DVT) or pulmonary embolism (PE). What are the risks? Generally, this is a safe procedure. However, problems may occur, including:  Infection.  Bleeding.  Allergic reactions to medicines.  Damage to other structures or organs.  Blood clots.  Injury to your baby.  What happens before the procedure?  Follow instructions from your health care provider about eating or drinking restrictions.  Follow instructions from your health care provider about bathing before your procedure to help reduce your risk of infection.  If you know that you are going to have a cesarean delivery, do not shave your pubic area. Shaving before the procedure may increase your risk of infection.  Ask your health care provider about: ? Changing or stopping your regular medicines. This is especially important if you are taking diabetes medicines or blood thinners. ? Your pain management plan. This is especially important if you plan to breastfeed your baby. ? How long you will be in the hospital after the procedure. ? Any concerns you may have about receiving blood products if you need them during the procedure. ? Cord blood banking, if you plan to collect your baby's umbilical cord blood.  You may also want to ask your  health care provider: ? Whether you will be able to hold or breastfeed your baby while you are still in the operating room. ? Whether your baby can stay with you immediately after the procedure and during your recovery. ? Whether a family member or a person of your choice can go with you into the operating room and stay with you during the procedure, immediately after the procedure, and during your recovery.  Plan to have someone drive you home when you are discharged from the hospital. What happens during the procedure?  Fetal monitors will be placed on your abdomen to monitor your heart rate and your baby's heart rate.  Depending on the reason for your cesarean delivery, you may have a physical exam or additional testing, such as an ultrasound.  An IV tube will be inserted into one of your veins.  You may have your blood or urine tested.  You will be given antibiotic medicine to help prevent infection.  You may be given a special warming gown to wear to keep your temperature stable.  Hair may be removed from your pubic area.  The skin of your pubic area and lower abdomen will be cleaned with a germ-killing solution (antiseptic).  A catheter may be inserted into your bladder through your urethra. This drains your urine during the procedure.  You may be given one or more of the following: ? A medicine to numb the area (local anesthetic). ? A medicine to make you fall asleep (general anesthetic). ? A medicine (regional anesthetic) that is injected into your back or through a small   thin tube placed in your back (spinal anesthetic or epidural anesthetic). This numbs everything below the injection site and allows you to stay awake during your procedure. If this makes you feel nauseous, tell your health care provider. Medicines will be available to help reduce any nausea you may feel.  An incision will be made in your abdomen, and then in your uterus.  If you are awake during your  procedure, you may feel tugging and pulling in your abdomen, but you should not feel pain. If you feel pain, tell your health care provider immediately.  Your baby will be removed from your uterus. You may feel more pressure or pushing while this happens.  Immediately after birth, your baby will be dried and kept warm. You may be able to hold and breastfeed your baby. The umbilical cord may be clamped and cut during this time.  Your placenta will be removed from your uterus.  Your incisions will be closed with stitches (sutures). Staples, skin glue, or adhesive strips may also be applied to the incision in your abdomen.  Bandages (dressings) will be placed over the incision in your abdomen. The procedure may vary among health care providers and hospitals. What happens after the procedure?  Your blood pressure, heart rate, breathing rate, and blood oxygen level will be monitored often until the medicines you were given have worn off.  You may continue to receive fluids and medicines through an IV tube.  You will have some pain. Medicines will be available to help control your pain.  To help prevent blood clots: ? You may be given medicines. ? You may have to wear compression stockings or devices. ? You will be encouraged to walk around when you are able.  Hospital staff will encourage and support bonding with your baby. Your hospital may allow you and your baby to stay in the same room (rooming in) during your hospital stay to encourage successful breastfeeding.  You may be encouraged to cough and breathe deeply often. This helps to prevent lung problems.  If you have a catheter draining your urine, it will be removed as soon as possible after your procedure. This information is not intended to replace advice given to you by your health care provider. Make sure you discuss any questions you have with your health care provider. Document Released: 04/20/2005 Document Revised: 09/26/2015  Document Reviewed: 01/29/2015 Elsevier Interactive Patient Education  2018 [employer]  

## 2017-09-17 ENCOUNTER — Inpatient Hospital Stay (HOSPITAL_COMMUNITY): Payer: Medicaid Other | Admitting: Anesthesiology

## 2017-09-17 ENCOUNTER — Inpatient Hospital Stay (HOSPITAL_COMMUNITY)
Admission: RE | Admit: 2017-09-17 | Discharge: 2017-09-20 | DRG: 788 | Disposition: A | Discharge status: Home / Self Care | Payer: Medicaid Other | Source: Ambulatory Visit | Attending: Obstetrics and Gynecology | Admitting: Obstetrics and Gynecology

## 2017-09-17 ENCOUNTER — Other Ambulatory Visit: Payer: Self-pay

## 2017-09-17 ENCOUNTER — Encounter (HOSPITAL_COMMUNITY)
Admission: RE | Disposition: A | Discharge status: Home / Self Care | Payer: Self-pay | Source: Ambulatory Visit | Attending: Obstetrics and Gynecology

## 2017-09-17 ENCOUNTER — Encounter (HOSPITAL_COMMUNITY): Payer: Self-pay | Admitting: *Deleted

## 2017-09-17 DIAGNOSIS — O34211 Maternal care for low transverse scar from previous cesarean delivery: Secondary | ICD-10-CM | POA: Diagnosis present

## 2017-09-17 DIAGNOSIS — Z3A39 39 weeks gestation of pregnancy: Secondary | ICD-10-CM | POA: Diagnosis not present

## 2017-09-17 DIAGNOSIS — O0990 Supervision of high risk pregnancy, unspecified, unspecified trimester: Secondary | ICD-10-CM

## 2017-09-17 DIAGNOSIS — Z98891 History of uterine scar from previous surgery: Secondary | ICD-10-CM

## 2017-09-17 DIAGNOSIS — O09523 Supervision of elderly multigravida, third trimester: Secondary | ICD-10-CM

## 2017-09-17 DIAGNOSIS — O3433 Maternal care for cervical incompetence, third trimester: Secondary | ICD-10-CM | POA: Diagnosis present

## 2017-09-17 LAB — RPR: RPR Ser Ql: NONREACTIVE

## 2017-09-17 LAB — CBC
HEMATOCRIT: 35.7 % — AB (ref 36.0–46.0)
HEMOGLOBIN: 12.2 g/dL (ref 12.0–15.0)
MCH: 32.4 pg (ref 26.0–34.0)
MCHC: 34.2 g/dL (ref 30.0–36.0)
MCV: 94.9 fL (ref 78.0–100.0)
Platelets: 218 10*3/uL (ref 150–400)
RBC: 3.76 MIL/uL — ABNORMAL LOW (ref 3.87–5.11)
RDW: 13.8 % (ref 11.5–15.5)
WBC: 17.7 10*3/uL — ABNORMAL HIGH (ref 4.0–10.5)

## 2017-09-17 LAB — CREATININE, SERUM
Creatinine, Ser: 0.47 mg/dL (ref 0.44–1.00)
GFR calc Af Amer: >60 mL/min (ref >60)

## 2017-09-17 SURGERY — Surgical Case
Anesthesia: Spinal

## 2017-09-17 MED ORDER — FENTANYL CITRATE (PF) 100 MCG/2ML IJ SOLN
INTRAMUSCULAR | Status: DC | PRN
  Administered 2017-09-17: 10 ug via INTRATHECAL

## 2017-09-17 MED ORDER — SENNOSIDES-DOCUSATE SODIUM 8.6-50 MG PO TABS
2.0000 | ORAL_TABLET | ORAL | Status: DC
  Administered 2017-09-17 – 2017-09-19 (×3): 2 via ORAL
  Filled 2017-09-17 (×3): qty 2

## 2017-09-17 MED ORDER — KETOROLAC TROMETHAMINE 30 MG/ML IJ SOLN: 30.0000 mg | Freq: Four times a day (QID) | INTRAMUSCULAR | Status: AC | PRN

## 2017-09-17 MED ORDER — DIBUCAINE 1 % RE OINT: 1.0000 "application " | TOPICAL_OINTMENT | RECTAL | Status: DC | PRN

## 2017-09-17 MED ORDER — NALBUPHINE HCL 10 MG/ML IJ SOLN: 5.0000 mg | INTRAMUSCULAR | Status: DC | PRN

## 2017-09-17 MED ORDER — OXYTOCIN 10 UNIT/ML IJ SOLN
INTRAMUSCULAR | Status: AC
  Filled 2017-09-17: qty 4

## 2017-09-17 MED ORDER — LACTATED RINGERS IV SOLN
INTRAVENOUS | Status: DC
  Administered 2017-09-17 (×2): 125 mL/h via INTRAVENOUS

## 2017-09-17 MED ORDER — PHENYLEPHRINE 8 MG IN D5W 100 ML (0.08MG/ML) PREMIX OPTIME
INJECTION | INTRAVENOUS | Status: DC | PRN
  Administered 2017-09-17: 20 ug/min via INTRAVENOUS

## 2017-09-17 MED ORDER — ONDANSETRON HCL 4 MG/2ML IJ SOLN
INTRAMUSCULAR | Status: AC
  Filled 2017-09-17: qty 2

## 2017-09-17 MED ORDER — COCONUT OIL OIL: 1.0000 "application " | TOPICAL_OIL | Status: DC | PRN

## 2017-09-17 MED ORDER — FENTANYL CITRATE (PF) 100 MCG/2ML IJ SOLN: 25.0000 ug | INTRAMUSCULAR | Status: DC | PRN

## 2017-09-17 MED ORDER — DIPHENHYDRAMINE HCL 25 MG PO CAPS: 25.0000 mg | ORAL_CAPSULE | ORAL | Status: DC | PRN

## 2017-09-17 MED ORDER — DEXAMETHASONE SODIUM PHOSPHATE 10 MG/ML IJ SOLN
INTRAMUSCULAR | Status: AC
  Filled 2017-09-17: qty 1

## 2017-09-17 MED ORDER — OXYTOCIN 40 UNITS IN LACTATED RINGERS INFUSION - SIMPLE MED: 2.5000 [IU]/h | INTRAVENOUS | Status: AC

## 2017-09-17 MED ORDER — NALBUPHINE HCL 10 MG/ML IJ SOLN: 5.0000 mg | Freq: Once | INTRAMUSCULAR | Status: DC | PRN

## 2017-09-17 MED ORDER — DIPHENHYDRAMINE HCL 50 MG/ML IJ SOLN: 12.5000 mg | INTRAMUSCULAR | Status: DC | PRN

## 2017-09-17 MED ORDER — OXYTOCIN 10 UNIT/ML IJ SOLN
INTRAVENOUS | Status: DC | PRN
  Administered 2017-09-17: 40 [IU] via INTRAVENOUS

## 2017-09-17 MED ORDER — MORPHINE SULFATE (PF) 0.5 MG/ML IJ SOLN
INTRAMUSCULAR | Status: AC
  Filled 2017-09-17: qty 10

## 2017-09-17 MED ORDER — OXYCODONE HCL 5 MG PO TABS: 10.0000 mg | ORAL_TABLET | ORAL | Status: DC | PRN

## 2017-09-17 MED ORDER — OXYCODONE HCL 5 MG PO TABS: 5.0000 mg | ORAL_TABLET | Freq: Once | ORAL | Status: DC | PRN

## 2017-09-17 MED ORDER — KETOROLAC TROMETHAMINE 30 MG/ML IJ SOLN
INTRAMUSCULAR | Status: DC | PRN
  Administered 2017-09-17: 30 mg via INTRAVENOUS

## 2017-09-17 MED ORDER — NALOXONE HCL 0.4 MG/ML IJ SOLN: 0.4000 mg | INTRAMUSCULAR | Status: DC | PRN

## 2017-09-17 MED ORDER — SCOPOLAMINE 1 MG/3DAYS TD PT72
MEDICATED_PATCH | TRANSDERMAL | Status: AC
  Filled 2017-09-17: qty 1

## 2017-09-17 MED ORDER — ONDANSETRON HCL 4 MG/2ML IJ SOLN
INTRAMUSCULAR | Status: DC | PRN
  Administered 2017-09-17: 4 mg via INTRAVENOUS

## 2017-09-17 MED ORDER — PHENYLEPHRINE 8 MG IN D5W 100 ML (0.08MG/ML) PREMIX OPTIME
INJECTION | INTRAVENOUS | Status: AC
  Filled 2017-09-17: qty 100

## 2017-09-17 MED ORDER — KETOROLAC TROMETHAMINE 30 MG/ML IJ SOLN
INTRAMUSCULAR | Status: AC
  Filled 2017-09-17: qty 1

## 2017-09-17 MED ORDER — DEXAMETHASONE SODIUM PHOSPHATE 10 MG/ML IJ SOLN
INTRAMUSCULAR | Status: DC | PRN
  Administered 2017-09-17: 10 mg via INTRAVENOUS

## 2017-09-17 MED ORDER — SODIUM CHLORIDE 0.9% FLUSH: 3.0000 mL | INTRAVENOUS | Status: DC | PRN

## 2017-09-17 MED ORDER — SCOPOLAMINE 1 MG/3DAYS TD PT72
1.0000 | MEDICATED_PATCH | Freq: Once | TRANSDERMAL | Status: AC
  Administered 2017-09-17: 1.5 mg via TRANSDERMAL

## 2017-09-17 MED ORDER — FENTANYL CITRATE (PF) 100 MCG/2ML IJ SOLN
INTRAMUSCULAR | Status: AC
  Filled 2017-09-17: qty 2

## 2017-09-17 MED ORDER — LACTATED RINGERS IV SOLN
INTRAVENOUS | Status: DC
  Administered 2017-09-17 (×2): via INTRAVENOUS

## 2017-09-17 MED ORDER — TETANUS-DIPHTH-ACELL PERTUSSIS 5-2.5-18.5 LF-MCG/0.5 IM SUSP: 0.5000 mL | Freq: Once | INTRAMUSCULAR | Status: DC

## 2017-09-17 MED ORDER — ONDANSETRON HCL 4 MG/2ML IJ SOLN: 4.0000 mg | Freq: Four times a day (QID) | INTRAMUSCULAR | Status: DC | PRN

## 2017-09-17 MED ORDER — MENTHOL 3 MG MT LOZG: 1.0000 | LOZENGE | OROMUCOSAL | Status: DC | PRN

## 2017-09-17 MED ORDER — IBUPROFEN 600 MG PO TABS
600.0000 mg | ORAL_TABLET | Freq: Four times a day (QID) | ORAL | Status: DC
  Administered 2017-09-17 – 2017-09-20 (×12): 600 mg via ORAL
  Filled 2017-09-17 (×12): qty 1

## 2017-09-17 MED ORDER — BUPIVACAINE HCL (PF) 0.5 % IJ SOLN
INTRAMUSCULAR | Status: AC
  Filled 2017-09-17: qty 30

## 2017-09-17 MED ORDER — MEPERIDINE HCL 25 MG/ML IJ SOLN: 6.2500 mg | INTRAMUSCULAR | Status: DC | PRN

## 2017-09-17 MED ORDER — BUPIVACAINE IN DEXTROSE 0.75-8.25 % IT SOLN
INTRATHECAL | Status: DC | PRN
  Administered 2017-09-17: 1.8 mL via INTRATHECAL

## 2017-09-17 MED ORDER — OXYCODONE HCL 5 MG PO TABS
5.0000 mg | ORAL_TABLET | ORAL | Status: DC | PRN
  Administered 2017-09-18 – 2017-09-20 (×3): 5 mg via ORAL
  Filled 2017-09-17 (×3): qty 1

## 2017-09-17 MED ORDER — OXYCODONE HCL 5 MG/5ML PO SOLN: 5.0000 mg | Freq: Once | ORAL | Status: DC | PRN

## 2017-09-17 MED ORDER — ENOXAPARIN SODIUM 40 MG/0.4ML ~~LOC~~ SOLN
40.0000 mg | Freq: Every day | SUBCUTANEOUS | Status: DC
  Administered 2017-09-17 – 2017-09-19 (×3): 40 mg via SUBCUTANEOUS
  Filled 2017-09-17 (×3): qty 0.4

## 2017-09-17 MED ORDER — MORPHINE SULFATE (PF) 0.5 MG/ML IJ SOLN
INTRAMUSCULAR | Status: DC | PRN
  Administered 2017-09-17: .2 mg via INTRATHECAL

## 2017-09-17 MED ORDER — SIMETHICONE 80 MG PO CHEW: 80.0000 mg | CHEWABLE_TABLET | ORAL | Status: DC | PRN

## 2017-09-17 MED ORDER — NALOXONE HCL 4 MG/10ML IJ SOLN: 1.0000 ug/kg/h | INTRAVENOUS | Status: DC | PRN

## 2017-09-17 MED ORDER — DIPHENHYDRAMINE HCL 25 MG PO CAPS: 25.0000 mg | ORAL_CAPSULE | Freq: Four times a day (QID) | ORAL | Status: DC | PRN

## 2017-09-17 MED ORDER — CEFAZOLIN SODIUM-DEXTROSE 2-4 GM/100ML-% IV SOLN
2.0000 g | INTRAVENOUS | Status: AC
Start: 2017-09-17 — End: 2017-09-17
  Administered 2017-09-17: 2 g via INTRAVENOUS
  Filled 2017-09-17: qty 100

## 2017-09-17 MED ORDER — ACETAMINOPHEN 325 MG PO TABS
650.0000 mg | ORAL_TABLET | ORAL | Status: DC | PRN
  Administered 2017-09-18 – 2017-09-20 (×3): 650 mg via ORAL
  Filled 2017-09-17 (×3): qty 2

## 2017-09-17 MED ORDER — PRENATAL MULTIVITAMIN CH
1.0000 | ORAL_TABLET | Freq: Every day | ORAL | Status: DC
  Administered 2017-09-18 – 2017-09-19 (×2): 1 via ORAL
  Filled 2017-09-17 (×2): qty 1

## 2017-09-17 MED ORDER — BUPIVACAINE HCL (PF) 0.5 % IJ SOLN
INTRAMUSCULAR | Status: DC | PRN
  Administered 2017-09-17: 30 mL

## 2017-09-17 MED ORDER — SIMETHICONE 80 MG PO CHEW
80.0000 mg | CHEWABLE_TABLET | Freq: Three times a day (TID) | ORAL | Status: DC
  Administered 2017-09-17 – 2017-09-20 (×6): 80 mg via ORAL
  Filled 2017-09-17 (×6): qty 1

## 2017-09-17 MED ORDER — SOD CITRATE-CITRIC ACID 500-334 MG/5ML PO SOLN
30.0000 mL | ORAL | Status: AC
  Administered 2017-09-17: 30 mL via ORAL
  Filled 2017-09-17: qty 15

## 2017-09-17 MED ORDER — ONDANSETRON HCL 4 MG/2ML IJ SOLN: 4.0000 mg | Freq: Three times a day (TID) | INTRAMUSCULAR | Status: DC | PRN

## 2017-09-17 MED ORDER — SIMETHICONE 80 MG PO CHEW
80.0000 mg | CHEWABLE_TABLET | ORAL | Status: DC
  Administered 2017-09-17 – 2017-09-19 (×3): 80 mg via ORAL
  Filled 2017-09-17 (×3): qty 1

## 2017-09-17 MED ORDER — WITCH HAZEL-GLYCERIN EX PADS: 1.0000 "application " | MEDICATED_PAD | CUTANEOUS | Status: DC | PRN

## 2017-09-17 MED ORDER — ZOLPIDEM TARTRATE 5 MG PO TABS: 5.0000 mg | ORAL_TABLET | Freq: Every evening | ORAL | Status: DC | PRN

## 2017-09-17 SURGICAL SUPPLY — 35 items
BENZOIN TINCTURE PRP APPL 2/3 (GAUZE/BANDAGES/DRESSINGS) ×3 IMPLANT
CHLORAPREP W/TINT 26ML (MISCELLANEOUS) ×3 IMPLANT
CLAMP CORD UMBIL (MISCELLANEOUS) IMPLANT
CLOSURE WOUND 1/2 X4 (GAUZE/BANDAGES/DRESSINGS) ×1
CLOTH BEACON ORANGE TIMEOUT ST (SAFETY) ×3 IMPLANT
DRSG OPSITE POSTOP 4X10 (GAUZE/BANDAGES/DRESSINGS) ×3 IMPLANT
ELECT REM PT RETURN 9FT ADLT (ELECTROSURGICAL) ×3
ELECTRODE REM PT RTRN 9FT ADLT (ELECTROSURGICAL) ×1 IMPLANT
EXTRACTOR VACUUM BELL STYLE (SUCTIONS) IMPLANT
GLOVE BIOGEL PI IND STRL 6.5 (GLOVE) ×1 IMPLANT
GLOVE BIOGEL PI IND STRL 7.0 (GLOVE) ×2 IMPLANT
GLOVE BIOGEL PI INDICATOR 6.5 (GLOVE) ×2
GLOVE BIOGEL PI INDICATOR 7.0 (GLOVE) ×4
GLOVE ORTHOPEDIC STR SZ6.5 (GLOVE) ×3 IMPLANT
GOWN STRL REUS W/TWL LRG LVL3 (GOWN DISPOSABLE) ×9 IMPLANT
HEMOSTAT ARISTA ABSORB 3G PWDR (MISCELLANEOUS) ×3 IMPLANT
KIT ABG SYR 3ML LUER SLIP (SYRINGE) IMPLANT
NEEDLE HYPO 22GX1.5 SAFETY (NEEDLE) ×3 IMPLANT
NEEDLE HYPO 25X1 1.5 SAFETY (NEEDLE) IMPLANT
NS IRRIG 1000ML POUR BTL (IV SOLUTION) ×3 IMPLANT
PACK C SECTION WH (CUSTOM PROCEDURE TRAY) ×3 IMPLANT
PAD OB MATERNITY 4.3X12.25 (PERSONAL CARE ITEMS) ×3 IMPLANT
PENCIL SMOKE EVAC W/HOLSTER (ELECTROSURGICAL) ×3 IMPLANT
STRIP CLOSURE SKIN 1/2X4 (GAUZE/BANDAGES/DRESSINGS) ×2 IMPLANT
SUT MON AB 4-0 PS1 27 (SUTURE) ×3 IMPLANT
SUT PLAIN 2 0 (SUTURE) ×2
SUT PLAIN ABS 2-0 CT1 27XMFL (SUTURE) ×1 IMPLANT
SUT VIC AB 0 CT1 36 (SUTURE) ×6 IMPLANT
SUT VIC AB 0 CTX 36 (SUTURE) ×2
SUT VIC AB 0 CTX36XBRD ANBCTRL (SUTURE) ×1 IMPLANT
SUT VIC AB 2-0 CT1 27 (SUTURE) ×4
SUT VIC AB 2-0 CT1 TAPERPNT 27 (SUTURE) ×2 IMPLANT
SYR CONTROL 10ML LL (SYRINGE) ×3 IMPLANT
TOWEL OR 17X24 6PK STRL BLUE (TOWEL DISPOSABLE) ×3 IMPLANT
TRAY FOLEY W/BAG SLVR 14FR LF (SET/KITS/TRAYS/PACK) ×3 IMPLANT

## 2017-09-17 NOTE — Anesthesia Postprocedure Evaluation (Signed)
Anesthesia Post Note  Patient: Brandi Wong  Procedure(s) Performed: REPEAT CESAREAN SECTION (N/A )     Anesthesia Type: Spinal Level of consciousness: awake, awake and alert and oriented Pain management: pain level controlled Vital Signs Assessment: post-procedure vital signs reviewed and stable Respiratory status: spontaneous breathing Cardiovascular status: stable and blood pressure returned to baseline Postop Assessment: spinal receding and patient able to bend at knees Anesthetic complications: no    Last Vitals:  Vitals:   09/17/17 1300 09/17/17 1423  BP: (!) 119/53 105/69  Pulse: 73 69  Resp: 16 17  Temp: 36.4 C 36.6 C  SpO2: 99% 100%    Last Pain:  Vitals:   09/17/17 1423  TempSrc: Axillary  PainSc:    Pain Goal: Patients Stated Pain Goal: 4 (09/17/17 0845)               Jennelle Human

## 2017-09-17 NOTE — Op Note (Addendum)
Brandi Wong PROCEDURE DATE: 09/17/2017  PREOPERATIVE DIAGNOSES: Intrauterine pregnancy at [redacted]w[redacted]d weeks gestation; patient declines vag del attempt  POSTOPERATIVE DIAGNOSES: The same  PROCEDURE: Repeat Low Transverse Cesarean Section  SURGEON:  Baldemar Lenis, MD  ASSISTANT:  Nettie Elm, MD  An experienced assistant was required given the standard of surgical care given the complexity of the case.  This assistant was needed for exposure, dissection, suctioning, retraction, instrument exchange, assisting with delivery with administration of fundal pressure, and for overall help during the procedure.  ANESTHESIOLOGY TEAM: Anesthesiologist: Achille Rich, MD CRNA: Armanda Heritage, CRNA  INDICATIONS: Brandi Wong is a 37 y.o. G2P2002 at [redacted]w[redacted]d here for cesarean section secondary to the indications listed under preoperative diagnoses; please see preoperative note for further details.  The risks of cesarean section were discussed with the patient including but were not limited to: bleeding which may require transfusion or reoperation; infection which may require antibiotics; injury to bowel, bladder, ureters or other surrounding organs; injury to the fetus; need for additional procedures including hysterectomy in the event of a life-threatening hemorrhage; placental abnormalities wth subsequent pregnancies, incisional problems, thromboembolic phenomenon and other postoperative/anesthesia complications.  She consents to blood transfusion in the event of an emergency. The patient verbalized understanding of the plan, giving informed written consent for the procedure.    FINDINGS:  Viable female infant in cephalic presentation.  Apgars 9 and 9.  Clear amniotic fluid.  Intact placenta, three vessel cord.  Normal uterus, fallopian tubes and ovaries bilaterally.  ANESTHESIA: Spinal  INTRAVENOUS FLUIDS: 1500 ml   ESTIMATED BLOOD LOSS: 560 ml URINE OUTPUT:  200 ml SPECIMENS: Placenta sent to  L&D COMPLICATIONS: None immediate  PROCEDURE IN DETAIL:  The patient preoperatively received intravenous antibiotics and had sequential compression devices applied to her lower extremities.  She was then taken to the operating room where spinal anesthesia was administered and was found to be adequate. She was then placed in a dorsal supine position with a leftward tilt, and prepped and draped in a sterile manner.  A foley catheter was placed into her bladder with sterile technique and attached to constant gravity.  After a timeout was performed, a Pfannenstiel skin incision was made with scalpel over her preexisting scar and carried through to the underlying layer of fascia. The fascia was incised in the midline, and this incision was extended bilaterally using the Mayo scissors.  Kocher clamps were applied to the superior aspect of the fascial incision and the underlying rectus muscles were dissected off bluntly.  A similar process was carried out on the inferior aspect of the fascial incision. The rectus muscles were separated in the midline bluntly and the peritoneum was entered bluntly. The peritoneal incision was carefully extended bluntly laterally and caudad with good visualization of the bladder. The uterus appeared normal. Attention was turned to the lower uterine segment where a bladder flap was made with metzenbaums as the bladder was noted to be tacked onto the lower uterine segment. With the bladder retracted behind the bladder blade, a low transverse hysterotomy was made with a scalpel and extended bilaterally bluntly.  The infant was delivered from ROP position, nose and mouth were bulb suctioned, and the cord clamped and cut after 1 minute. The infant was then handed over to the waiting neonatology team. Uterine massage was then performed, and the placenta delivered intact with a three-vessel cord. The uterus was then gently exteriorized and cleared of clots and debris.  The hysterotomy was closed  with  0 Vicryl in a running locked fashion, and an imbricating layer was also placed with 0 Vicryl. One figure-of-eight 0 Vicryl serosal stitch was placed in the lower uterus segment for a small amount of bleeding.  The fallopian tubes and ovaries were visualized bilaterally and normal appearing. The uterus was gently replaced within the uterus.   The pelvis was cleared of all clot and debris. Arista was placed over the hysterotomy for additional hemostasis. Hemostasis was then confirmed on all surfaces. The peritoneum was re-approximated using 2-0 Vicryl. The fascia was then closed using 0 Vicryl in a running fashion.  The subcutaneous layer was irrigated, then reapproximated with 2-0 plain gut interrupted stitches, and 20 ml of 0.5% bupivicaine was injected subcutaneously around the incision.  The skin was closed with a 4-0 Monocryl subcuticular stitch. The patient tolerated the procedure well. Using a graves speculum, the patient's vagina was then examined for any remaining cerclage material, none noted. Sponge, lap, instrument and needle counts were correct x 3.  She was taken to the recovery room in stable condition.    Baldemar Lenis, M.D. Attending Obstetrician & Gynecologist, [facility] for [employer], [facility]

## 2017-09-17 NOTE — Transfer of Care (Signed)
Immediate Anesthesia Transfer of Care Note  Patient: Brandi Wong  Procedure(s) Performed: REPEAT CESAREAN SECTION (N/A )  Patient Location: PACU  Anesthesia Type:Spinal  Level of Consciousness: awake, alert  and oriented  Airway & Oxygen Therapy: Patient Spontanous Breathing  Post-op Assessment: Report given to RN  Post vital signs: Reviewed and stable  Last Vitals:  Vitals Value Taken Time  BP    Temp    Pulse    Resp    SpO2      Last Pain:  Vitals:   09/17/17 0845  TempSrc: Oral  PainSc: 0-No pain      Patients Stated Pain Goal: 4 (09/17/17 0845)  Complications: No apparent anesthesia complications

## 2017-09-17 NOTE — Anesthesia Procedure Notes (Signed)
Spinal  Patient location during procedure: OR Start time: 09/17/2017 9:30 AM End time: 09/17/2017 9:34 AM Staffing Anesthesiologist: Achille Rich, MD Performed: anesthesiologist  Preanesthetic Checklist Completed: patient identified, surgical consent, pre-op evaluation, timeout performed, IV checked, risks and benefits discussed and monitors and equipment checked Spinal Block Patient position: sitting Prep: DuraPrep Patient monitoring: cardiac monitor, continuous pulse ox and blood pressure Approach: midline Location: L3-4 Injection technique: single-shot Needle Needle type: Pencan  Needle gauge: 24 G Needle length: 9 cm Assessment Sensory level: T10 Additional Notes Functioning IV was confirmed and monitors were applied. Sterile prep and drape, including hand hygiene and sterile gloves were used. The patient was positioned and the spine was prepped. The skin was anesthetized with lidocaine.  Free flow of clear CSF was obtained prior to injecting local anesthetic into the CSF.  The spinal needle aspirated freely following injection.  The needle was carefully withdrawn.  The patient tolerated the procedure well.

## 2017-09-17 NOTE — Anesthesia Preprocedure Evaluation (Signed)
Anesthesia Evaluation  Patient identified by MRN, date of birth, ID band Patient awake    Reviewed: Allergy & Precautions, H&P , NPO status , Patient's Chart, lab work & pertinent test results  Airway Mallampati: II   Neck ROM: full    Dental   Pulmonary neg pulmonary ROS,    breath sounds clear to auscultation       Cardiovascular negative cardio ROS   Rhythm:regular Rate:Normal     Neuro/Psych  Headaches,    GI/Hepatic   Endo/Other    Renal/GU      Musculoskeletal   Abdominal   Peds  Hematology   Anesthesia Other Findings   Reproductive/Obstetrics (+) Pregnancy                             Anesthesia Physical Anesthesia Plan  ASA: II  Anesthesia Plan: Spinal   Post-op Pain Management:    Induction: Intravenous  PONV Risk Score and Plan: 2 and Ondansetron and Treatment may vary due to age or medical condition  Airway Management Planned: Nasal Cannula  Additional Equipment:   Intra-op Plan:   Post-operative Plan:   Informed Consent: I have reviewed the patients History and Physical, chart, labs and discussed the procedure including the risks, benefits and alternatives for the proposed anesthesia with the patient or authorized representative who has indicated his/her understanding and acceptance.     Plan Discussed with: CRNA, Anesthesiologist and Surgeon  Anesthesia Plan Comments:         Anesthesia Quick Evaluation

## 2017-09-17 NOTE — Lactation Note (Signed)
This note was copied from a baby's chart. Lactation Consultation Note  Patient Name: Brandi Wong [id number] Date: 09/17/2017 Reason for consult: Initial assessment;Term;Hyperbilirubinemia  8 hours old FT female who is being exclusively BF by his mother, she's a P2 and experienced BF. She was able to BF her first child for 9 months, but she also had to supplement due to self reported low milk supply. Mom did mentioned she exclusively BF the first 15 days of life though before she had to supplement with formula.   Baby was asleep under the lights when entering the room; he was put on triple phototherapy, set up a DEBP for mom, reviewed pump instructions, cleaning and storage. Per mom BF is going well so far, feedings at the breast are comfortable and she's able to hear swallows. Both of her nipples looked intact upon examination.  Encouraged mom to feed baby keeping him in the blue blanket 8-12 times/24 hours or sooner if feeding cues are present. She'll also pump after feedings around 8 times/24 hours and feed baby any amount of EBM that she gets with finger or spoon feeding. BF brochure, BF resources and feeding diary were reviewed. Mom is aware of LC services and will call PRN.  Maternal Data Formula Feeding for Exclusion: No Has patient been taught Hand Expression?: Yes Does the patient have breastfeeding experience prior to this delivery?: Yes  Feeding Feeding Type: Breast Fed Length of feed: 10 min    Interventions Interventions: Breast feeding basics reviewed;Expressed milk;DEBP  Lactation Tools Discussed/Used Tools: Pump Breast pump type: Double-Electric Breast Pump WIC Program: Yes Pump Review: Setup, frequency, and cleaning;Milk Storage Initiated by:: MPeck Date initiated:: 09/17/17   Consult Status Consult Status: Follow-up Date: 09/18/17 Follow-up type: In-patient    Brandi Wong 09/17/2017, 6:54 PM

## 2017-09-17 NOTE — H&P (Signed)
Obstetric History and Physical  Brandi Wong is a 37 y.o. G2P1001 with IUP at [redacted]w[redacted]d presenting for elective repeat c-section. Patient denies contractions, leaking, bleeding. Reports normal fetal movement. She does report she saw a piece of blue suture approx 2-3 cm long fall out of vagina, this is likely remaining piece of cerclage.  Prenatal Course Source of Care: [facility]  with onset of care at 14 weeks Pregnancy complications or risks: Patient Active Problem List   Diagnosis Date Noted  . Headache in pregnancy 05/05/2017  . Nausea/vomiting in pregnancy 05/05/2017  . Cervical cerclage suture present, third trimester 04/30/2017  . Supervision of high risk pregnancy, antepartum 03/31/2017  . Supervision of elderly multigravida in third trimester 03/31/2017  . Dental caries 03/31/2017  . Previous cesarean section 03/31/2017   She plans to breastfeed She desires no method for postpartum contraception.   Prenatal labs and studies: ABO, Rh: --/--/O POS (05/16 1030) Antibody: NEG (05/16 1030) Rubella: 3.72 (12/27 0933) RPR: Non Reactive (05/16 1030)  HBsAg: Negative (12/27 0933)  HIV: Non Reactive (04/01 0912)  GBS:  2 hr GTT:  82/166/73 Genetic screening normal Anatomy US abnormal with shortened cervix and funneling, subsequently had cerclage placed  Medical History:  Past Medical History:  Diagnosis Date  . Medical history non-contributory     Past Surgical History:  Procedure Laterality Date  . CERVICAL CERCLAGE N/A 04/30/2017   Procedure: CERCLAGE CERVICAL;  Surgeon: Willodean Rosenthal, MD;  Location: [facility];  Service: Gynecology;  Laterality: N/A;  . CERVICAL CERCLAGE N/A 05/02/2017   Procedure: CERCLAGE CERVICAL;  Surgeon: Willodean Rosenthal, MD;  Location: [facility];  Service: Gynecology;  Laterality: N/A;  . CESAREAN SECTION      OB History  Gravida Para Term Preterm AB Living  SAB TAB Ectopic Multiple Live Births          1    # Outcome  Date GA Lbr Len/2nd Weight Sex Delivery Anes PTL Lv  2 Current           1 Term 07/09/15 [redacted]w[redacted]d   F CS-LTranv EPI N LIV     Complications: Cephalopelvic Disproportion    Social History   Socioeconomic History  . Marital status: Married    Spouse name: Not on file  . Number of children: Not on file  . Years of education: Not on file  . Highest education level: Not on file  Occupational History  . Not on file  Social Needs  . Financial resource strain: Not on file  . Food insecurity:    Worry: Not on file    Inability: Not on file  . Transportation needs:    Medical: Not on file    Non-medical: Not on file  Tobacco Use  . Smoking status: Never Smoker  . Smokeless tobacco: Never Used  Substance and Sexual Activity  . Alcohol use: No    Frequency: Never  . Drug use: No  . Sexual activity: Yes  Lifestyle  . Physical activity:    Days per week: Not on file    Minutes per session: Not on file  . Stress: Not on file  Relationships  . Social connections:    Talks on phone: Not on file    Gets together: Not on file    Attends religious service: Not on file    Active member of club or organization: Not on file    Attends meetings of clubs or organizations:  Not on file    Relationship status: Not on file  Other Topics Concern  . Not on file  Social History Narrative  . Not on file    History reviewed. No pertinent family history.  Medications Prior to Admission  Medication Sig Dispense Refill Last Dose  . prenatal vitamin w/FE, FA (PRENATAL 1 + 1) 27-1 MG TABS tablet Take 1 tablet by mouth daily. 30 each 11 Taking    No Known Allergies  Review of Systems: Negative except for what is mentioned in HPI.  Physical Exam: BP 120/80   Temp (!) 97.5 F (36.4 C) (Oral)   Resp 18   Ht  (1.626 m)   Wt 175 lb 4.8 oz (79.5 kg)   LMP 12/18/2016 (Exact Date)   BMI 30.09 kg/m  CONSTITUTIONAL: Well-developed, well-nourished female in no acute distress.  HENT:   Normocephalic, atraumatic, External right and left ear normal. Oropharynx is clear and moist EYES: Conjunctivae and EOM are normal. Pupils are equal, round, and reactive to light. No scleral icterus.  NECK: Normal range of motion, supple, no masses SKIN: Skin is warm and dry. No rash noted. Not diaphoretic. No erythema. No pallor. NEUROLOGIC: Alert and oriented to person, place, and time. Normal reflexes, muscle tone coordination. No cranial nerve deficit noted. PSYCHIATRIC: Normal mood and affect. Normal behavior. Normal judgment and thought content. CARDIOVASCULAR: Normal heart rate noted, regular rhythm RESPIRATORY: Effort and breath sounds normal, no problems with respiration noted ABDOMEN: Soft, nontender, nondistended, gravid. MUSCULOSKELETAL: Normal range of motion. No edema and no tenderness. 2+ distal pulses.    Pertinent Labs/Studies:   Results for orders placed or performed during the hospital encounter of 09/16/17 (from the past 24 hour(s))  CBC     Status: Abnormal   Collection Time: 09/16/17 10:30 AM  Result Value Ref Range   WBC 10.9 (H) 4.0 - 10.5 K/uL   RBC 3.78 (L) 3.87 - 5.11 MIL/uL   Hemoglobin 12.3 12.0 - 15.0 g/dL   HCT 16.1 (L) 09.6 - 04.5 %   MCV 93.4 78.0 - 100.0 fL   MCH 32.5 26.0 - 34.0 pg   MCHC 34.8 30.0 - 36.0 g/dL   RDW 40.9 81.1 - 91.4 %   Platelets 219 150 - 400 K/uL  RPR     Status: None   Collection Time: 09/16/17 10:30 AM  Result Value Ref Range   RPR Ser Ql Non Reactive Non Reactive  Type and screen [facility]     Status: None   Collection Time: 09/16/17 10:30 AM  Result Value Ref Range   ABO/RH(D) O POS    Antibody Screen NEG    Sample Expiration      09/19/2017 Performed at [facility], [address], [place], [place] [address]     Assessment : Brandi Wong is a 37 y.o. G2P1001 at [redacted]w[redacted]d being admitted for elective repeat c-section. The risks of cesarean section were discussed with the patient; including but  not limited to: infection which may require antibiotics; bleeding which may require transfusion or re-operation; injury to bowel, bladder, ureters or other surrounding organs; injury to the fetus; need for additional procedures, incisional problems, thromboembolic phenomenon and other postoperative/anesthesia complications. Answered all questions. The patient verbalized understanding of the plan, giving informed consent for the procedure. Will also do vaginal exam to look for any remaining piece of cerclage, however suspect it is entirely out as patient saw one piece come out, only one piece had been expected  to remain.  Patient has been NPO since 12 am, she will remain NPO for procedure. Anesthesia and OR aware. Preoperative prophylactic antibiotics and SCDs ordered on call to the OR.  To OR when ready.    Baldemar Lenis, M.D. Attending Obstetrician & Gynecologist, [facility] for [employer], [facility]  09/17/2017, 8:59 AM

## 2017-09-18 LAB — CBC
HCT: 30.4 % — ABNORMAL LOW (ref 36.0–46.0)
Hemoglobin: 10.3 g/dL — ABNORMAL LOW (ref 12.0–15.0)
MCH: 32.1 pg (ref 26.0–34.0)
MCHC: 33.9 g/dL (ref 30.0–36.0)
MCV: 94.7 fL (ref 78.0–100.0)
Platelets: 218 10*3/uL (ref 150–400)
RBC: 3.21 MIL/uL — ABNORMAL LOW (ref 3.87–5.11)
RDW: 13.9 % (ref 11.5–15.5)
WBC: 16.3 10*3/uL — AB (ref 4.0–10.5)

## 2017-09-18 NOTE — Plan of Care (Signed)
Pt. Condition will continue to improve 

## 2017-09-18 NOTE — Progress Notes (Addendum)
Subjective: Postpartum Day 1: Cesarean Delivery Patient reports tolerating PO, + flatus and no problems voiding.    Objective: Vital signs in last 24 hours: Temp:  [96.4 F (35.8 C)-99.8 F (37.7 C)] 99 F (37.2 C) (05/18 0600) Pulse Rate:  [64-83] 81 (05/18 0600) Resp:  [12-20] 20 (05/18 0600) BP: (99-120)/(53-80) 99/63 (05/18 0600) SpO2:  [97 %-100 %] 100 % (05/18 0600) Weight:  [79.5 kg (175 lb 4.8 oz)] 79.5 kg (175 lb 4.8 oz) (05/17 0845)  Physical Exam:  General: alert, cooperative and no distress Lochia: appropriate Uterine Fundus: firm Incision: healing well, no dehiscence, no significant erythema, slight dried drainage on dressing, no new drainage. DVT Evaluation: No evidence of DVT seen on physical exam. Negative Homan's sign. No cords or calf tenderness. No significant calf/ankle edema.  Recent Labs    09/17/17 1351 09/18/17 0542  HGB 12.2 10.3*  HCT 35.7* 30.4*    Assessment/Plan: Status post Cesarean section. Doing well postoperatively.  Continue current care.  Brandi Wong 09/18/2017, 7:43 AM   CNM attestation Post Partum Day #1 I have seen and examined this patient and agree with above documentation in the resident's note.   Brandi Wong is a 37 y.o. G2P2002 s/p rLTCS.  Pt denies problems with ambulating, voiding or po intake. Pain is well controlled.  Plan for birth control is no method- will discuss during hospitalization.  Method of Feeding: pumping (infant in NICU)  PE:  BP 113/78   Pulse 78   Temp 98.4 F (36.9 C) (Oral)   Resp 18   Ht  (1.626 m)   Wt 79.5 kg (175 lb 4.8 oz)   LMP 12/18/2016 (Exact Date)   SpO2 100%   Breastfeeding? Unknown   BMI 30.09 kg/m  Fundus firm  Plan for discharge: 09/20/17 with having NICU infant  Cam Hai, CNM 10:41 AM 09/18/2017

## 2017-09-18 NOTE — Discharge Instructions (Signed)
Places to have your son circumcised:    [facility] [phone] while you are in hospital  [facility] [phone] $244 by 4 wks  [facility] [phone] $175 by 2 wks  [facility] [phone] $[phone] by 7 days [facility] [phone] $[phone] by 4 wks  These prices sometimes change but are roughly what you can expect to pay. Please call and confirm pricing.   Circumcision is considered an elective/non-medically necessary procedure. There are many reasons parents decide to have their sons circumsized. During the first year of life circumcised males have a reduced risk of urinary tract infections but after this year the rates between circumcised males and uncircumcised males are the same.  It is safe to have your son circumcised outside of the hospital and the places above perform them regularly.

## 2017-09-19 ENCOUNTER — Encounter (HOSPITAL_COMMUNITY): Payer: Self-pay | Admitting: Obstetrics and Gynecology

## 2017-09-19 NOTE — Progress Notes (Addendum)
Subjective: Postpartum Day 2: Cesarean Delivery Patient reports tolerating PO, + flatus, + BM and no problems voiding.    Objective: Vital signs in last 24 hours: Temp:  [97.7 F (36.5 C)-98.6 F (37 C)] 97.7 F (36.5 C) (05/19 0559) Pulse Rate:  [76-90] 88 (05/19 0559) Resp:  [15-18] 18 (05/19 0559) BP: (110-113)/(68-80) 110/68 (05/19 0559) SpO2:  [100 %] 100 % (05/18 2050)  Physical Exam:  General: alert, cooperative, appears stated age and no distress Lochia: appropriate Uterine Fundus: firm Incision: healing well, no dehiscence DVT Evaluation: No evidence of DVT seen on physical exam. Negative Homan's sign. No cords or calf tenderness. No significant calf/ankle edema.  Recent Labs    09/17/17 1351 09/18/17 0542  HGB 12.2 10.3*  HCT 35.7* 30.4*    Assessment/Plan: Status post Cesarean section. Doing well postoperatively.  Continue current care.  [name] 09/19/2017, 6:24 AM  I assessed this pt and agree with the above assessment

## 2017-09-20 ENCOUNTER — Other Ambulatory Visit: Payer: Self-pay

## 2017-09-20 MED ORDER — OXYCODONE HCL 5 MG PO TABS: 5.0000 mg | ORAL_TABLET | ORAL | 0 refills | Status: AC | PRN

## 2017-09-20 MED ORDER — IBUPROFEN 600 MG PO TABS: 600.0000 mg | ORAL_TABLET | Freq: Four times a day (QID) | ORAL | 0 refills | Status: AC

## 2017-09-20 NOTE — Lactation Note (Addendum)
This note was copied from a baby's chart. Lactation Consultation Note  Patient Name: Brandi Wong ZOXWR'[id number] Date: 09/20/2017 Reason for consult: Follow-up assessment;NICU baby;Term;Other (Comment)(per NICU RN baby off the lights since yesterday )  LC  Visited mom in the room, she mentioned she had several pumping of 60 ml and took milk to NICU .  LC assessed breast tissue with mom permission and noted the breast to be very full .  NICU RN into see mom to set up a Pedis appt. And mentioned the baby last fed at 8 am.  LC mentioned moms milk is in and when baby is due to feed to have NICU call mom to feed/ or LC or [facility] east.  Also LC enc mom to take her hand pump up to release some of the fullness prior to latch.  Mom denies soreness, sore nipple and engorgement prevention and tx reviewed.  Mother informed of post-discharge support and given phone number to the lactation department, including services for phone call assistance; out-patient appointments; and breastfeeding support group. List of other breastfeeding resources in the community given in the handout. Encouraged mother to call for problems or concerns related to breastfeeding.  LC notified WIC to see mom to sign her up for [facility] .   LC had instructed mom on the use hand pump and checked flange/ #24 F good fit for today , and LC recommended  If the breast are really full , #27 F would work better.     Maternal Data Has patient been taught Hand Expression?: Yes  Feeding Feeding Type: (enc mom to breast feed when in NICU / enc mom to take her hand pump with her ) Nipple Type: Slow - flow Length of feed: 20 min  LATCH Score                   Interventions Interventions: Breast feeding basics reviewed;Hand pump;DEBP  Lactation Tools Discussed/Used Tools: Pump Breast pump type: Manual;Double-Electric Breast Pump WIC Program: (per mom had it with 1st abby , but not resigned up ) Pump Review: Setup, frequency, and  cleaning Initiated by:: MAI  Date initiated:: 09/20/17   Consult Status Consult Status: (baby going home today per RN from NICU )    [name] [name] 09/20/2017, 11:01 AM

## 2017-09-20 NOTE — Lactation Note (Signed)
This note was copied from a baby's chart. Lactation Consultation Note  Patient Name: Boy Deandra Goering ZOXWR'[id number] Date: 09/20/2017 Reason for consult: Follow-up assessment;NICU baby(LC rechecking to make sure mom has post pumped on the left breast )  3 rd visit today ,  Mom getting relief with hand pump .  Mom aware to call WIC / [place] , phone # given to mom,    Maternal Data Has patient been taught Hand Expression?: Yes  Feeding Feeding Type: Breast Fed Length of feed: (baby had been feeding already 10 mins when LC arrived / and still feeding )  LATCH Score Latch: (latched with depth on the right breast / modified cradle )  Audible Swallowing: (multiple swallows )     Comfort (Breast/Nipple): (per mom comfortable  )        Interventions Interventions: Breast feeding basics reviewed(reviewed basics/ and see earlier consult )  Lactation Tools Discussed/Used Tools: Flanges Flange Size: 24;27;Other (comment)(#27 F better fit with fullness ) Breast pump type: Manual WIC Program: (#phone given to call WIC ) Pump Review: Setup, frequency, and cleaning Initiated by:: MAI  Date initiated:: 09/20/17   Consult Status Consult Status: Complete Date: 09/20/17    [name] Celicia Minahan 09/20/2017, 1:44 PM

## 2017-09-20 NOTE — Discharge Summary (Signed)
OB Discharge Summary     Patient Name: Brandi Wong DOB: 03/07/1981 MRN: [id number]  Date of admission: 09/17/2017 Delivering MD: Conan Bowens   Date of discharge: 09/20/2017  Admitting diagnosis: RCS Intrauterine pregnancy: [redacted]w[redacted]d     Secondary diagnosis:  Principal Problem:   Previous cesarean section Active Problems:   Supervision of high risk pregnancy, antepartum   Supervision of elderly multigravida in third trimester   Cervical cerclage suture present, third trimester   S/P repeat low transverse C-section  Additional problems: none     Discharge diagnosis: Term Pregnancy Delivered                                                                                                Post partum procedures:none  Augmentation: none  Complications: None  Hospital course:  Sceduled C/S   37 y.o. yo G2P2002 at [redacted]w[redacted]d was admitted to the hospital 09/17/2017 for scheduled cesarean section with the following indication:Elective Repeat.  Membrane Rupture Time/Date: 10:08 AM ,09/17/2017   Patient delivered a Viable infant.09/17/2017  Details of operation can be found in separate operative note.  Pateint had an uncomplicated postpartum course.  She is ambulating, tolerating a regular diet, passing flatus, and urinating well. Patient is discharged home in stable condition on  09/20/17         Physical exam  Vitals:   09/19/17 0559 09/19/17 1816 09/19/17 2039 09/20/17 0613  BP: 110/68 138/62 113/63 119/66  Pulse: 88 74 80 77  Resp: Temp: 97.7 F (36.5 C) 99.1 F (37.3 C) 98.3 F (36.8 C) 98.7 F (37.1 C)  TempSrc: Oral Oral Oral Oral  SpO2:   98% 100%  Weight:      Height:       General: alert, cooperative and no distress Lochia: appropriate Uterine Fundus: firm Incision: Healing well with no significant drainage, Dressing is clean, dry, and intact DVT Evaluation: No evidence of DVT seen on physical exam. No significant calf/ankle edema. Labs: Lab Results   Component Value Date   WBC 16.3 (H) 09/18/2017   HGB 10.3 (L) 09/18/2017   HCT 30.4 (L) 09/18/2017   MCV 94.7 09/18/2017   PLT 218 09/18/2017   CMP Latest Ref Rng & Units 09/17/2017  Creatinine 0.44 - 1.00 mg/dL 4.09    Discharge instruction: per After Visit Summary and "Baby and Me Booklet".  After visit meds:  Allergies as of 09/20/2017   No Known Allergies     Medication List    TAKE these medications   oxyCODONE 5 MG immediate release tablet Commonly known as:  Oxy IR/ROXICODONE Take 1 tablet (5 mg total) by mouth every 4 (four) hours as needed for up to 5 days (pain scale 4-7).   prenatal vitamin w/FE, FA 27-1 MG Tabs tablet Take 1 tablet by mouth daily.       Diet: routine diet  Activity: Advance as tolerated. Pelvic rest for 6 weeks.   Outpatient follow up:2 weeks Follow up Appt: Future Appointments  Date Time Provider Department Center  10/01/2017  1:30 PM [facility] NURSE [facility]  10/18/2017 10:35 AM Conan Bowens, MD [facility]   Follow up Visit:No follow-ups on file.  Postpartum contraception: None  Newborn Data: Live born female  Birth Weight: 7 lb 7.9 oz (3400 g) APGAR: 9, 9  Newborn Delivery   Birth date/time:  09/17/2017 10:09:00 Delivery type:  C-Section, Low Transverse Trial of labor:  No C-section categorization:  Repeat     Baby Feeding: Breast Disposition:NICU   09/20/2017 Sharyon Cable, CNM

## 2017-10-01 ENCOUNTER — Ambulatory Visit: Payer: Medicaid Other

## 2017-10-04 ENCOUNTER — Ambulatory Visit (INDEPENDENT_AMBULATORY_CARE_PROVIDER_SITE_OTHER): Payer: Medicaid Other | Admitting: General Practice

## 2017-10-04 VITALS — BP 128/84 | HR 66 | Ht 65.0 in | Wt 163.0 lb

## 2017-10-04 DIAGNOSIS — Z5189 Encounter for other specified aftercare: Secondary | ICD-10-CM

## 2017-10-04 NOTE — Progress Notes (Signed)
I have reviewed this chart and agree with the RN/CMA assessment and management.    K. Meryl Bernice Mullin, M.D. Attending Obstetrician & Gynecologist, [facility], [facility]  

## 2017-10-04 NOTE — Progress Notes (Signed)
Patient presents to office today for incision check post repeat c-section. Incision is clean, dry & intact. Advised she make a follow up post partum appt. Patient had no questions.

## 2017-10-18 ENCOUNTER — Ambulatory Visit: Payer: Medicaid Other | Admitting: Obstetrics and Gynecology

## 2019-10-28 IMAGING — US US MFM OB FOLLOW-UP
1 series · 13 of 28 positions shown · non-contrast
Comparison: none

[Series 1: us mfm ob follow-up · 13 of 49 slices shown]
[im 2/49]
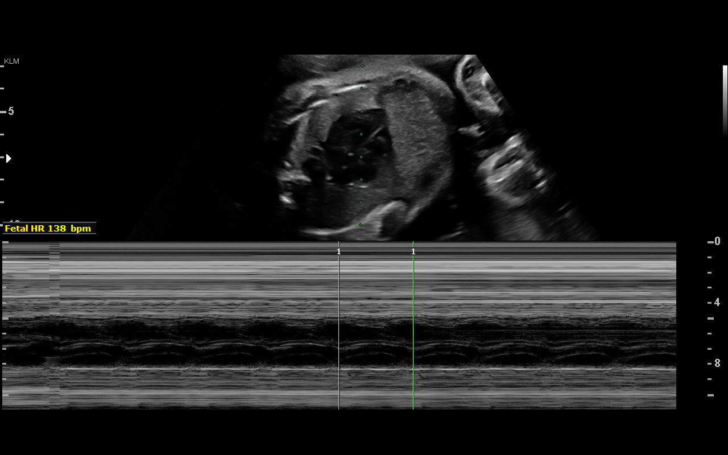
[im 6/49]
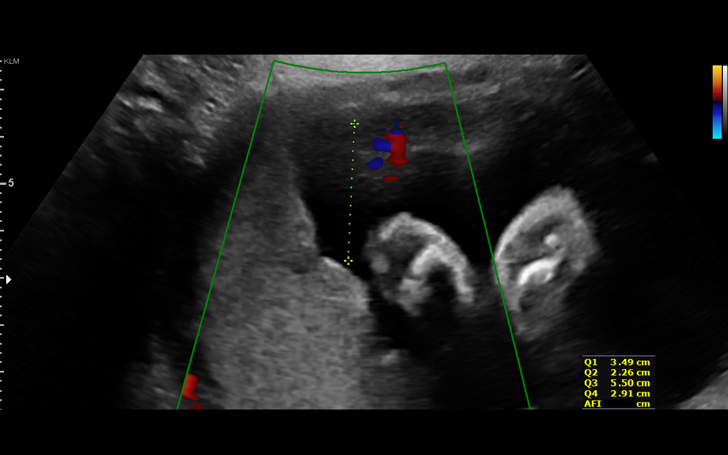
[im 9/49]
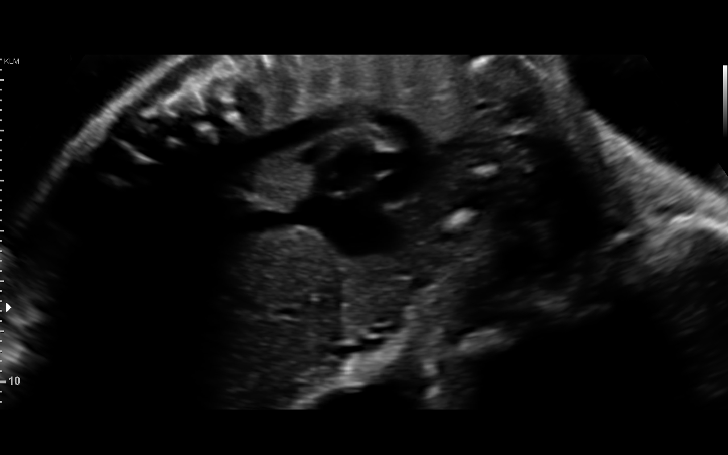
[im 13/49]
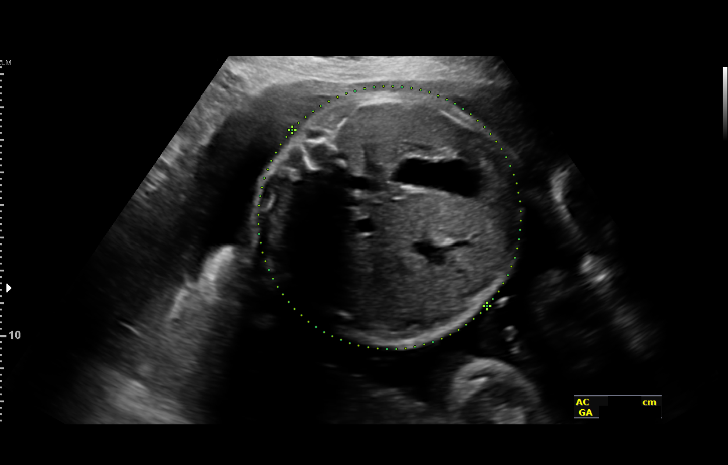
[im 17/49]
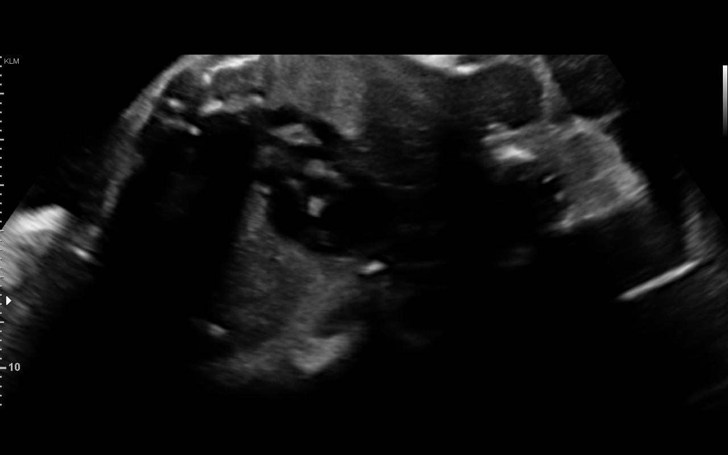
[im 20/49]
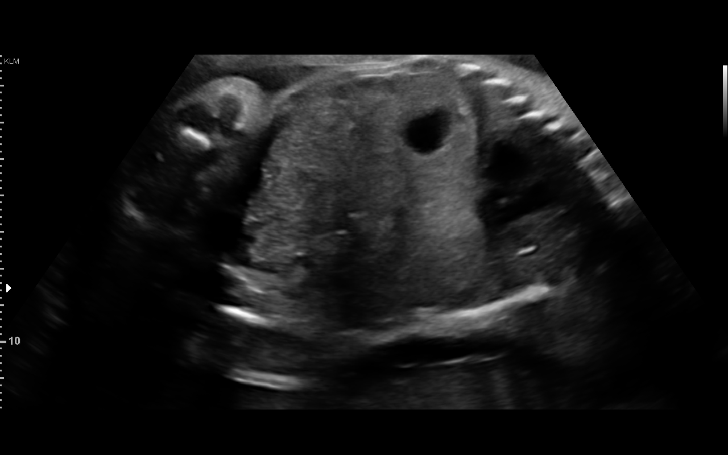
[im 25/49]
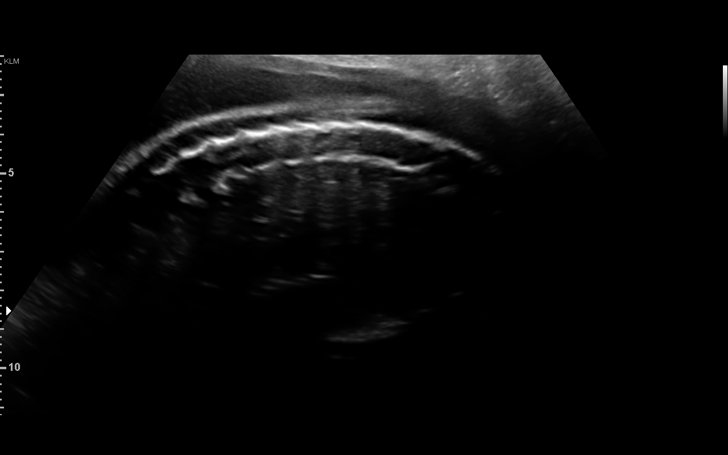
[im 29/49]
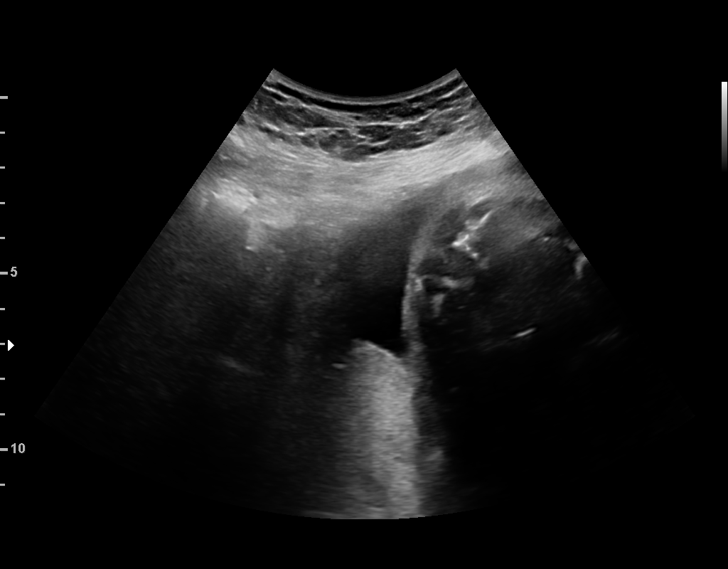
[im 33/49]
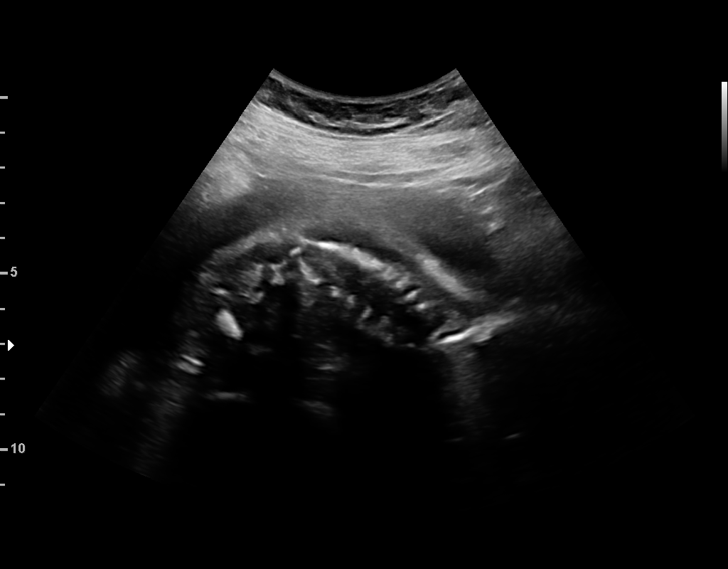
[im 36/49]
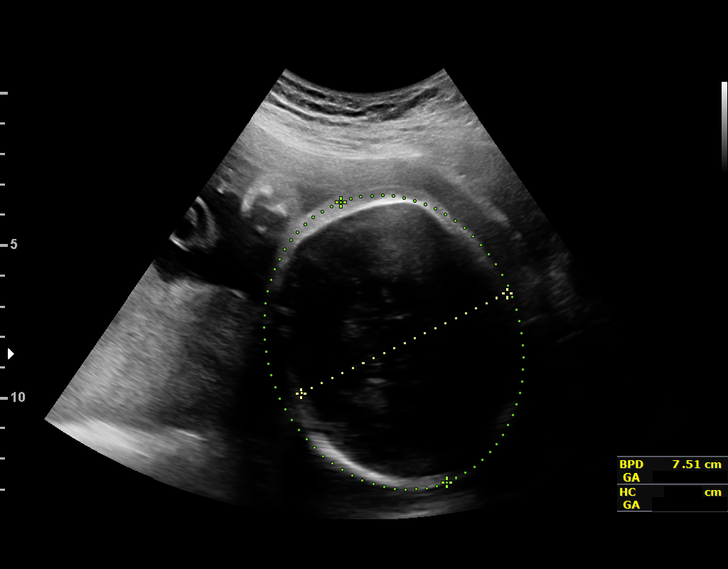
[im 40/49]
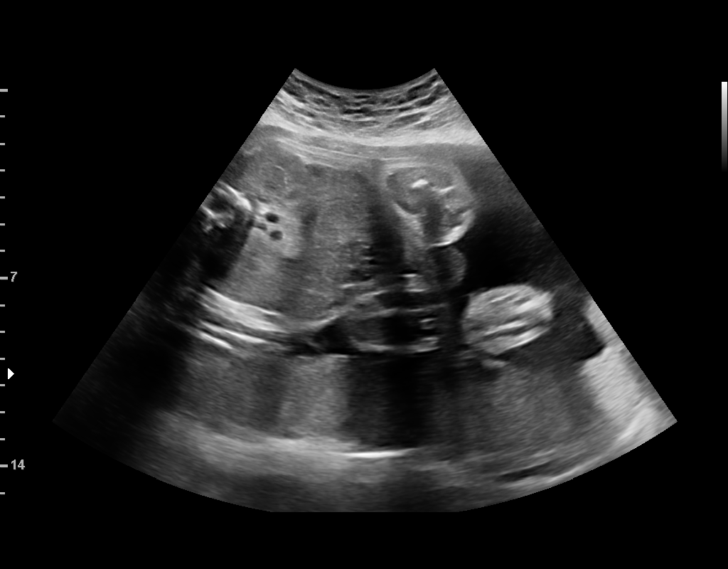
[im 43/49]
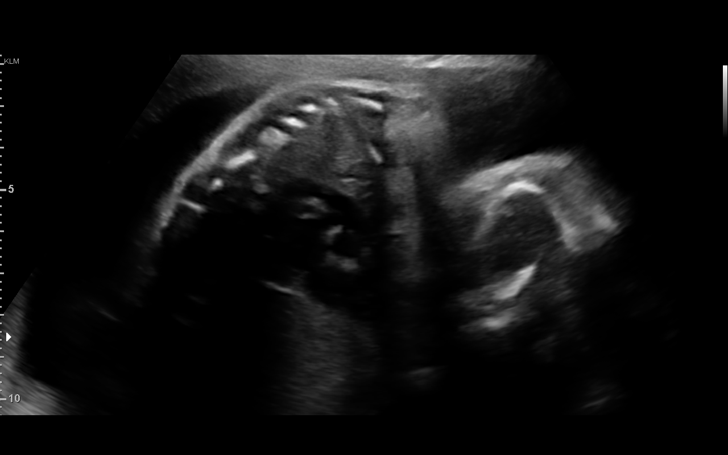
[im 47/49]
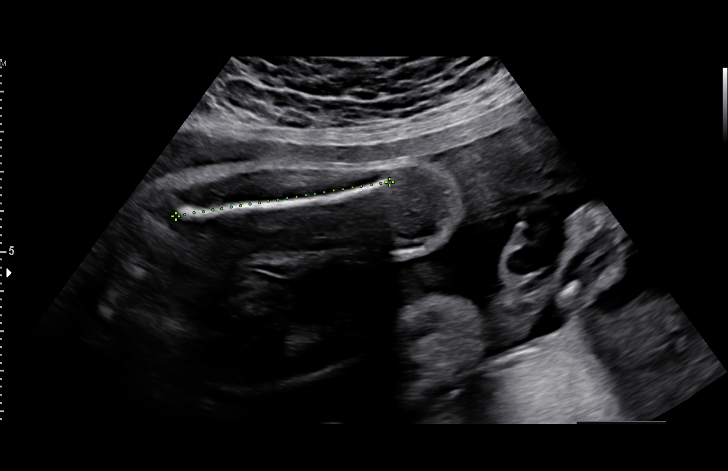

[13 of 28 positions shown; findings below may reference images not displayed]

Indications

28 weeks gestation of pregnancy
Advanced maternal age multigravida 35+,
third trimester (low risk panorama)
Cervical insufficiency,3rd
Cervical cerclage suture present, third
trimester
OB History

Blood Type:            Height:  5'4"   Weight (lb):  154       BMI:
Gravidity:    2         Term:   1
Living:       1
Fetal Evaluation

Num Of Fetuses:     1
Fetal Heart         138
Rate(bpm):
Cardiac Activity:   Observed
Presentation:       Cephalic
Placenta:           Posterior, above cervical os
P. Cord Insertion:  Previously Visualized

Amniotic Fluid
AFI FV:      Subjectively within normal limits

AFI Sum(cm)     %Tile       Largest Pocket(cm)
14.16           47

RUQ(cm)       RLQ(cm)       LUQ(cm)        LLQ(cm)
3.49
Biometry

BPD:      76.3  mm     G. Age:  30w 4d         92  %    CI:         71.3   %    70 - 86
FL/HC:      18.5   %    19.6 -
HC:      287.8  mm     G. Age:  31w 5d         94  %    HC/AC:      1.14        0.99 -
AC:      251.5  mm     G. Age:  29w 2d         67  %    FL/BPD:     69.9   %    71 - 87
FL:       53.3  mm     G. Age:  28w 2d         27  %    FL/AC:      21.2   %    20 - 24

Est. FW:    9109  gm           3 lb     65  %
Gestational Age

LMP:           28w 4d        Date:  12/18/16                 EDD:   09/24/17
U/S Today:     30w 0d                                        EDD:   09/14/17
Best:          28w 4d     Det. By:  LMP  (12/18/16)          EDD:   09/24/17
Anatomy

Cranium:               Appears normal         Aortic Arch:            Appears normal
Cavum:                 Previously seen        Ductal Arch:            Appears normal
Ventricles:            Previously seen        Diaphragm:              Appears normal
Choroid Plexus:        Previously seen        Stomach:                Appears normal, left
sided
Cerebellum:            Previously seen        Abdomen:                Appears normal
Posterior Fossa:       Previously seen        Abdominal Wall:         Previously seen
Nuchal Fold:           Previously seen        Cord Vessels:           Previously seen
Face:                  Appears normal         Kidneys:                Appear normal
(orbits and profile)
Lips:                  Previously seen        Bladder:                Appears normal
Thoracic:              Appears normal         Spine:                  Appears normal
Heart:                 Appears normal         Upper Extremities:      Previously seen
(4CH, axis, and situs
RVOT:                  Appears normal         Lower Extremities:      Previously seen
LVOT:                  Previously seen

Other:  Male gender. Heels and 5th digit previously visualized. Nasal bone
previously visualized. Technically difficult due to fetal position.
Cervix Uterus Adnexa

Cervix
Length:            2.9  cm.
measured by transabdominal scan.
Impression

Single living intrauterine pregnancy at 28w 4d, AMA
Cephalic presentation.
Placenta Posterior, above cervical os.
Normal amniotic fluid volume.
Appropriate interval fetal growth.
Normal interval fetal anatomy.
Recommendations

AMA: Interval growth in 6 weeks.
CI: given patient is >28 wks, cervical length is only beneficial
in setting of threatened preterm labor.  no symptoms today.

## 2019-12-08 IMAGING — US US MFM OB FOLLOW-UP
1 series · 14 of 28 positions shown · non-contrast
Comparison: none

Indications

34 weeks gestation of pregnancy
Advanced maternal age multigravida 33+,
third trimester (low risk panorama)
Cervical insufficiency,3rd
Cervical cerclage suture present, third
trimester
Previous cesarean delivery, antepartum
OB History
Blood Type:            Height:  5'4"   Weight (lb):  154       BMI:
Gravidity:    2         Term:   1
Living:       1
Fetal Evaluation
Num Of Fetuses:     1
Fetal Heart         165
Rate(bpm):
Cardiac Activity:   Observed
Presentation:       Cephalic
Placenta:           Posterior, above cervical os
P. Cord Insertion:  Previously Visualized
Amniotic Fluid
AFI FV:      Subjectively within normal limits
AFI Sum(cm)     %Tile       Largest Pocket(cm)
17.64           65
RUQ(cm)       RLQ(cm)       LUQ(cm)        LLQ(cm)
6.43
Biometry
BPD:      89.8  mm     G. Age:  36w 3d         93  %    CI:        74.87   %    70 - 86
FL/HC:      19.6   %    19.4 -
HC:      329.3  mm     G. Age:  37w 3d         86  %    HC/AC:      1.00        0.96 -
AC:      329.3  mm     G. Age:  36w 6d       > 97  %    FL/BPD:     71.8   %    71 - 87
FL:       64.5  mm     G. Age:  33w 2d         16  %    FL/AC:      19.6   %    20 - 24
Est. FW:    2620  gm      6 lb 3 oz     84  %
Gestational Age
LMP:           34w 3d        Date:  12/18/16                 EDD:   09/24/17
U/S Today:     36w 0d                                        EDD:   09/13/17
Best:          34w 3d     Det. By:  LMP  (12/18/16)          EDD:   09/24/17
Anatomy
Cranium:               Appears normal         Aortic Arch:            Previously seen
Cavum:                 Previously seen        Ductal Arch:            Previously seen
Ventricles:            Previously seen        Diaphragm:              Appears normal
Choroid Plexus:        Previously seen        Stomach:                Appears normal, left
sided
Cerebellum:            Previously seen        Abdomen:                Appears normal
Posterior Fossa:       Previously seen        Abdominal Wall:         Previously seen
Nuchal Fold:           Previously seen        Cord Vessels:           Previously seen
Face:                  Orbits and profile     Kidneys:                Appear normal
previously seen
Lips:                  Previously seen        Bladder:                Appears normal
Thoracic:              Appears normal         Spine:                  Previously seen
Heart:                 Appears normal         Upper Extremities:      Previously seen
(4CH, axis, and situs
RVOT:                  Appears normal         Lower Extremities:      Previously seen
LVOT:                  Appears normal
Other:  Male gender. Heels, 5th digit, and Nasal bone previously visualized.
Technically difficult due to fetal position.
Cervix Uterus Adnexa
Cervix
Not visualized (advanced GA >58wks)
Impression
INDICATION: 36 yr old [id number] at 12w1d with previous cervical
shortening s/p ultrasound indicated cerclage for fetal growth.

[Series 1: us mfm ob follow-up · 44 acquisitions, 14 frames shown]
[im 2/44]
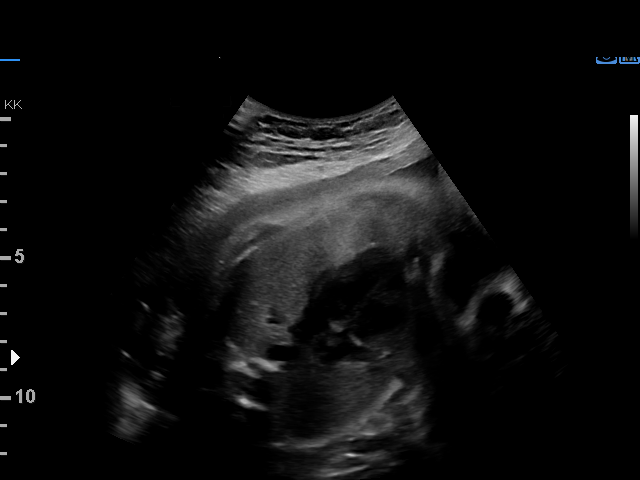
[im 5/44]
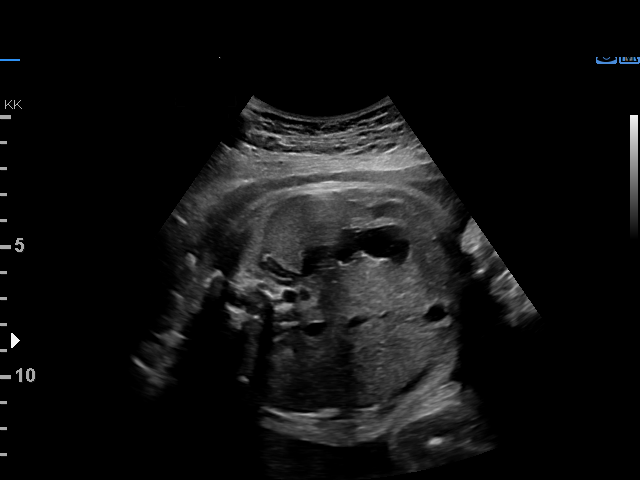
[im 8/44]
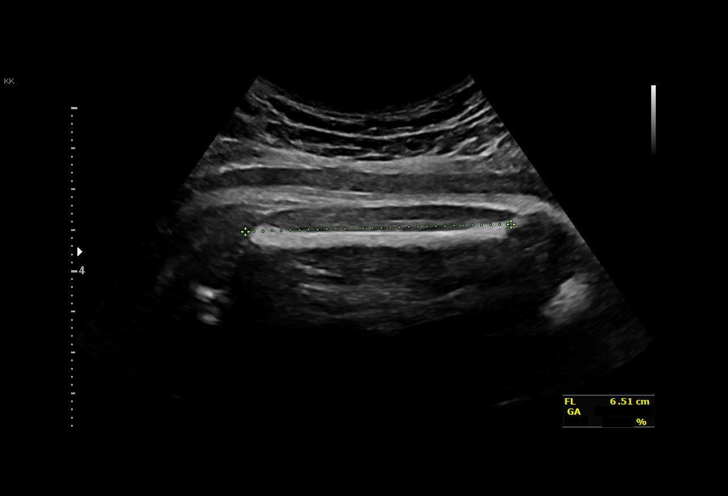
[im 12/44]
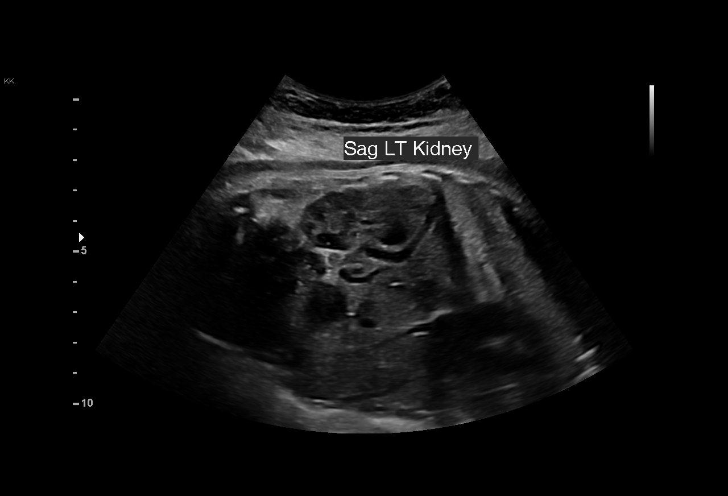
[im 15/44]
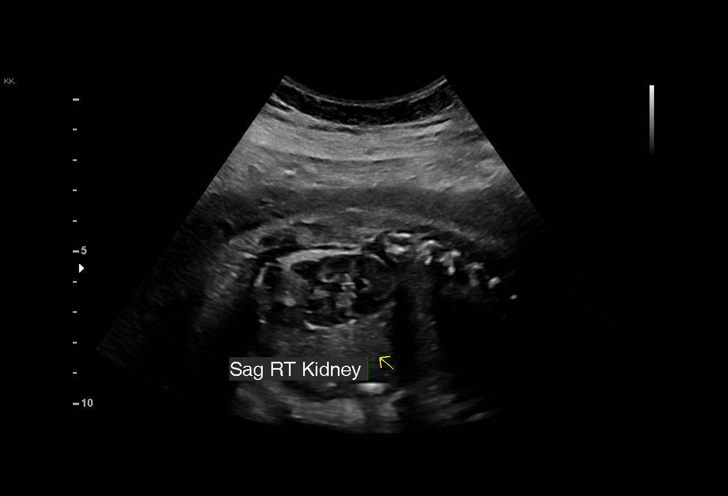
[im 18/44]
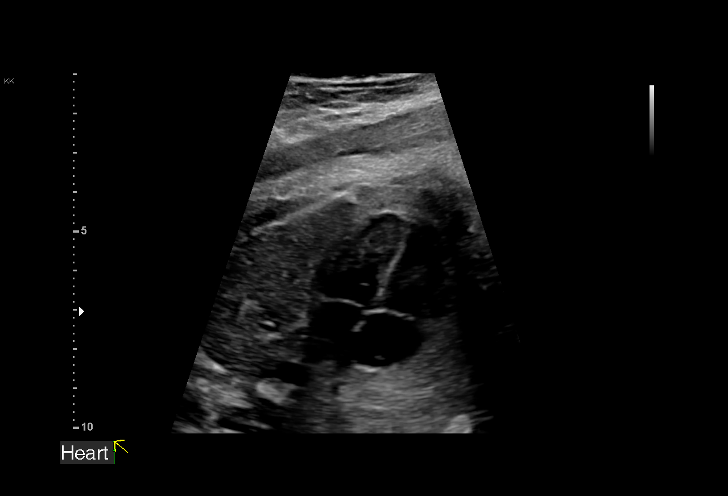
[im 21/44]
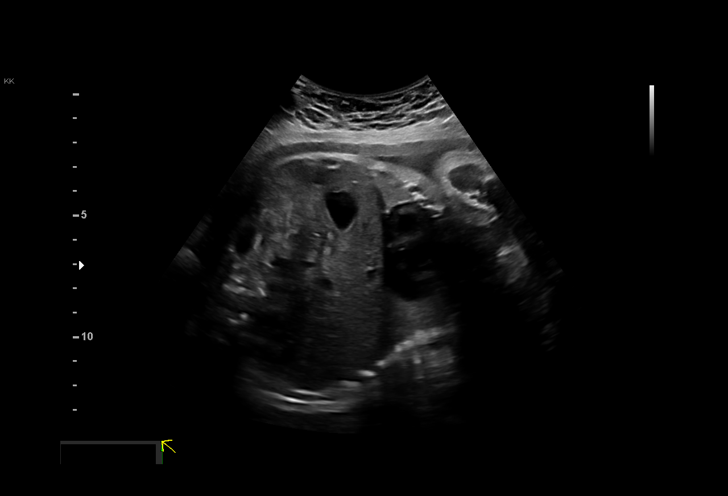
[im 24/44]
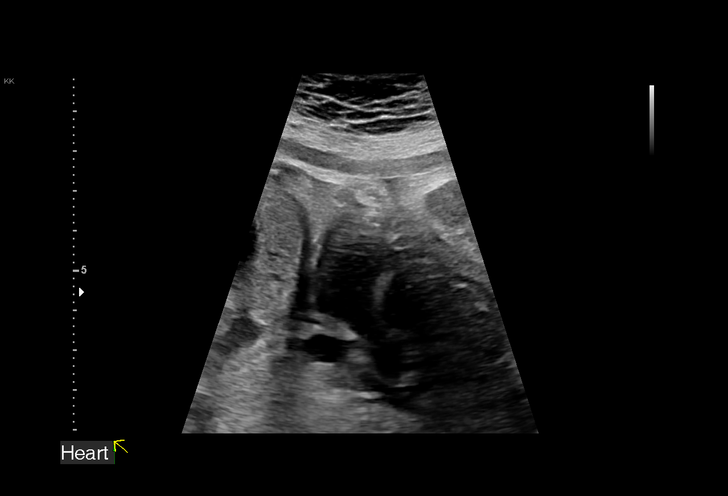
[im 28/44]
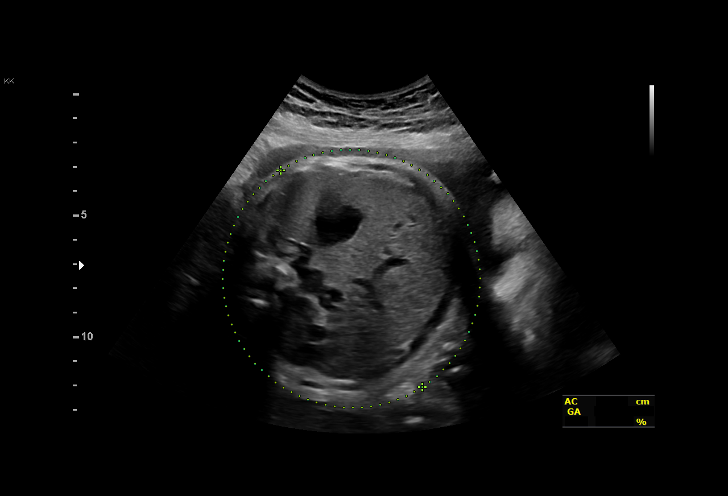
[im 31/44]
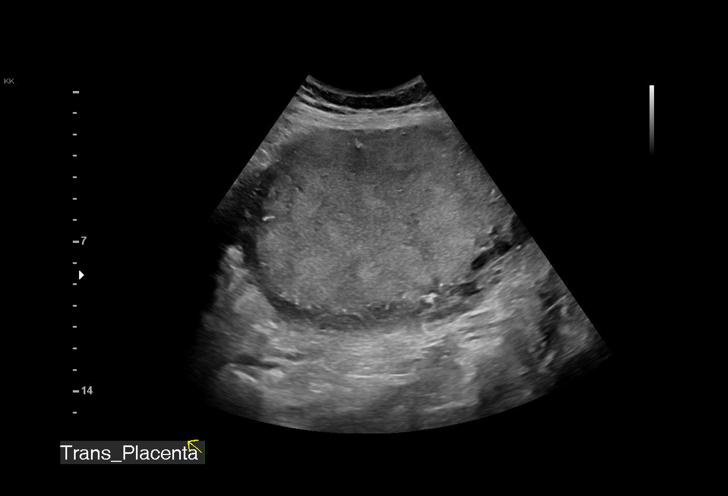
[im 34/44]
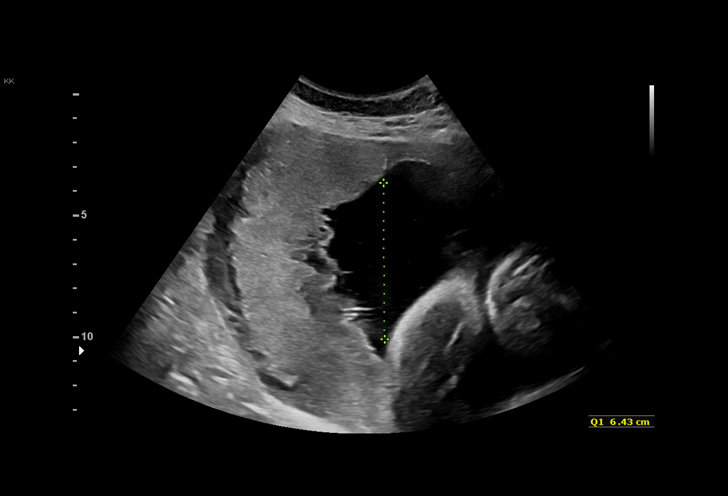
[im 37/44]
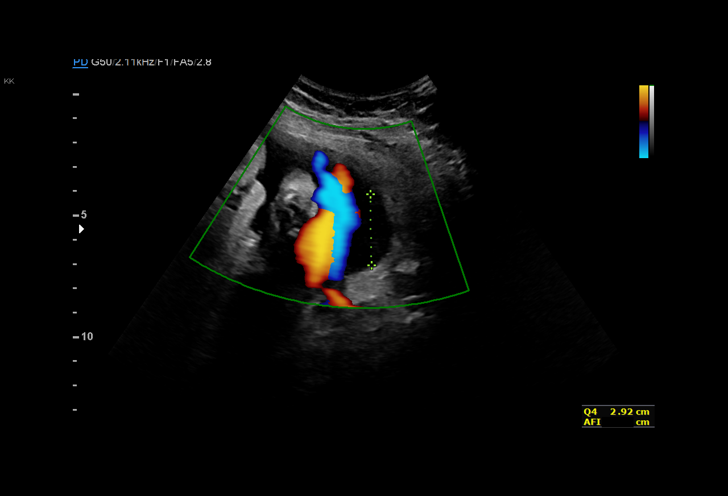
[im 40/44]
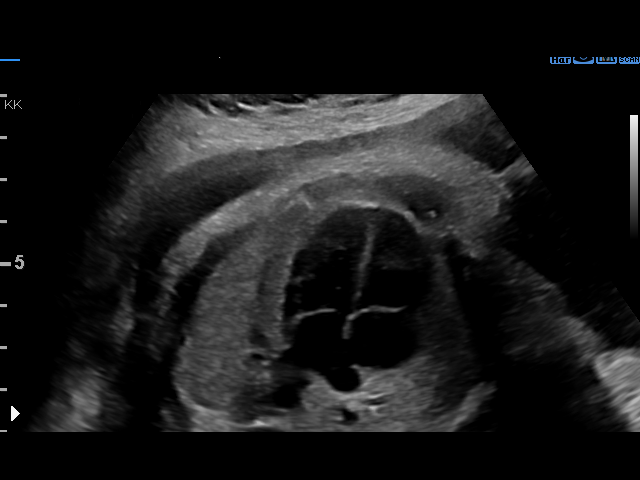
[im 44/44]
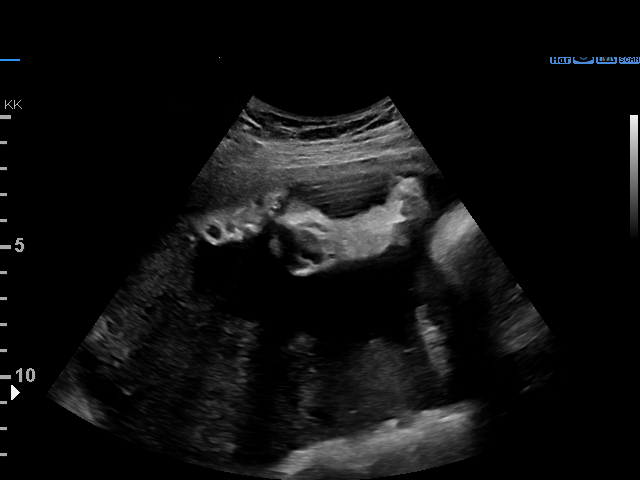

[14 of 28 positions shown; findings below may reference images not displayed]

FINDINGS: 1. Single intrauterine pregnancy with normal cardiac activity.
2. Estimated fetal weight is in the 84th%.
3. Posterior placenta without evidence of previa.
4. Normal amniotic fluid index.
5. The limited anatomy survey is normal.
Recommendations

1. Appropriate fetal growth.
2. Previous cervical shortening:
- s/p ultrasound indicated cerclage
- recommend preterm labor precautions
3. Advanced maternal age:
- low risk cell free fetal DNA
4. Recommend follow up ultrasounds as clinically indicated
